# Patient Record
Sex: Female | Born: 1990 | Race: White | Hispanic: No | Marital: Single | Smoking: Former smoker
Health system: Southern US, Community
[De-identification: ages and names within clinical notes are randomized; demographics above are authoritative.]

## PROBLEM LIST (undated history)

## (undated) DIAGNOSIS — F419 Anxiety disorder, unspecified: Secondary | ICD-10-CM

## (undated) HISTORY — PX: BACK SURGERY: SHX140

---
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
## 2008-08-22 ENCOUNTER — Emergency Department: Payer: Self-pay | Admitting: Emergency Medicine

## 2014-09-09 ENCOUNTER — Ambulatory Visit: Payer: Self-pay

## 2014-09-09 LAB — RAPID STREP-A WITH REFLX: Micro Text Report: NEGATIVE

## 2014-09-12 LAB — BETA STREP CULTURE(ARMC)

## 2015-02-09 ENCOUNTER — Ambulatory Visit
Admit: 2015-02-09 | Disposition: A | Discharge status: Home / Self Care | Payer: Self-pay | Attending: Internal Medicine | Admitting: Internal Medicine

## 2016-01-18 ENCOUNTER — Emergency Department
Admission: EM | Admit: 2016-01-18 | Discharge: 2016-01-18 | Disposition: A | Discharge status: Home / Self Care | Payer: BC Managed Care – PPO | Attending: Emergency Medicine | Admitting: Emergency Medicine

## 2016-01-18 ENCOUNTER — Encounter: Payer: Self-pay | Admitting: Emergency Medicine

## 2016-01-18 DIAGNOSIS — W260XXA Contact with knife, initial encounter: Secondary | ICD-10-CM | POA: Insufficient documentation

## 2016-01-18 DIAGNOSIS — Y92009 Unspecified place in unspecified non-institutional (private) residence as the place of occurrence of the external cause: Secondary | ICD-10-CM | POA: Diagnosis not present

## 2016-01-18 DIAGNOSIS — Y998 Other external cause status: Secondary | ICD-10-CM | POA: Insufficient documentation

## 2016-01-18 DIAGNOSIS — S61311A Laceration without foreign body of left index finger with damage to nail, initial encounter: Secondary | ICD-10-CM

## 2016-01-18 DIAGNOSIS — Z23 Encounter for immunization: Secondary | ICD-10-CM | POA: Insufficient documentation

## 2016-01-18 DIAGNOSIS — Y9389 Activity, other specified: Secondary | ICD-10-CM | POA: Diagnosis not present

## 2016-01-18 MED ORDER — BACITRACIN ZINC 500 UNIT/GM EX OINT
TOPICAL_OINTMENT | Freq: Two times a day (BID) | CUTANEOUS | Status: DC
  Administered 2016-01-18: 1 via TOPICAL
  Filled 2016-01-18: qty 0.9

## 2016-01-18 MED ORDER — OXYCODONE-ACETAMINOPHEN 5-325 MG PO TABS
1.0000 | ORAL_TABLET | Freq: Once | ORAL | Status: AC
  Administered 2016-01-18: 1 via ORAL
  Filled 2016-01-18: qty 1

## 2016-01-18 MED ORDER — TETANUS-DIPHTH-ACELL PERTUSSIS 5-2.5-18.5 LF-MCG/0.5 IM SUSP
0.5000 mL | Freq: Once | INTRAMUSCULAR | Status: AC
  Administered 2016-01-18: 0.5 mL via INTRAMUSCULAR
  Filled 2016-01-18: qty 0.5

## 2016-01-18 MED ORDER — OXYCODONE-ACETAMINOPHEN 7.5-325 MG PO TABS: 1.0000 | ORAL_TABLET | Freq: Four times a day (QID) | ORAL | Status: DC | PRN

## 2016-01-18 NOTE — Discharge Instructions (Signed)
Laceration Care, Adult °A laceration is a cut that goes through all layers of the skin. The cut also goes into the tissue that is right under the skin. Some cuts heal on their own. Others need to be closed with stitches (sutures), staples, skin adhesive strips, or wound glue. Taking care of your cut lowers your risk of infection and helps your cut to heal better. °HOW TO TAKE CARE OF YOUR CUT °For stitches or staples: °· Keep the wound clean and dry. °· If you were given a bandage (dressing), you should change it at least one time per day or as told by your doctor. You should also change it if it gets wet or dirty. °· Keep the wound completely dry for the first 24 hours or as told by your doctor. After that time, you may take a shower or a bath. However, make sure that the wound is not soaked in water until after the stitches or staples have been removed. °· Clean the wound one time each day or as told by your doctor: °· Wash the wound with soap and water. °· Rinse the wound with water until all of the soap comes off. °· Pat the wound dry with a clean towel. Do not rub the wound. °· After you clean the wound, put a thin layer of antibiotic ointment on it as told by your doctor. This ointment: °· Helps to prevent infection. °· Keeps the bandage from sticking to the wound. °· Have your stitches or staples removed as told by your doctor. °If your doctor used skin adhesive strips:  °· Keep the wound clean and dry. °· If you were given a bandage, you should change it at least one time per day or as told by your doctor. You should also change it if it gets dirty or wet. °· Do not get the skin adhesive strips wet. You can take a shower or a bath, but be careful to keep the wound dry. °· If the wound gets wet, pat it dry with a clean towel. Do not rub the wound. °· Skin adhesive strips fall off on their own. You can trim the strips as the wound heals. Do not remove any strips that are still stuck to the wound. They will  fall off after a while. °If your doctor used wound glue: °· Try to keep your wound dry, but you may briefly wet it in the shower or bath. Do not soak the wound in water, such as by swimming. °· After you take a shower or a bath, gently pat the wound dry with a clean towel. Do not rub the wound. °· Do not do any activities that will make you really sweaty until the skin glue has fallen off on its own. °· Do not apply liquid, cream, or ointment medicine to your wound while the skin glue is still on. °· If you were given a bandage, you should change it at least one time per day or as told by your doctor. You should also change it if it gets dirty or wet. °· If a bandage is placed over the wound, do not let the tape for the bandage touch the skin glue. °· Do not pick at the glue. The skin glue usually stays on for 5-10 days. Then, it falls off of the skin. °General Instructions  °· To help prevent scarring, make sure to cover your wound with sunscreen whenever you are outside after stitches are removed, after adhesive strips are removed,   or when wound glue stays in place and the wound is healed. Make sure to wear a sunscreen of at least 30 SPF.  Take over-the-counter and prescription medicines only as told by your doctor.  If you were given antibiotic medicine or ointment, take or apply it as told by your doctor. Do not stop using the antibiotic even if your wound is getting better.  Do not scratch or pick at the wound.  Keep all follow-up visits as told by your doctor. This is important.  Check your wound every day for signs of infection. Watch for:  Redness, swelling, or pain.  Fluid, blood, or pus.  Raise (elevate) the injured area above the level of your heart while you are sitting or lying down, if possible. GET HELP IF:  You got a tetanus shot and you have any of these problems at the injection site:  Swelling.  Very bad pain.  Redness.  Bleeding.  You have a fever.  A wound that was  closed breaks open.  You notice a bad smell coming from your wound or your bandage.  You notice something coming out of the wound, such as wood or glass.  Medicine does not help your pain.  You have more redness, swelling, or pain at the site of your wound.  You have fluid, blood, or pus coming from your wound.  You notice a change in the color of your skin near your wound.  You need to change the bandage often because fluid, blood, or pus is coming from the wound.  You start to have a new rash.  You start to have numbness around the wound. GET HELP RIGHT AWAY IF:  You have very bad swelling around the wound.  Your pain suddenly gets worse and is very bad.  You notice painful lumps near the wound or on skin that is anywhere on your body.  You have a red streak going away from your wound.  The wound is on your hand or foot and you cannot move a finger or toe like you usually can.  The wound is on your hand or foot and you notice that your fingers or toes look pale or bluish.   This information is not intended to replace advice given to you by your health care provider. Make sure you discuss any questions you have with your health care provider.   Document Released: 04/12/2008 Document Revised: 03/11/2015 Document Reviewed: 10/21/2014 Elsevier Interactive Patient Education 2016 [employer]  Fingernail or Toenail Removal, Care After Refer to this sheet in the next few weeks. These instructions provide you with information about caring for yourself after your procedure. Your health care provider may also give you more specific instructions. Your treatment has been planned according to current medical practices, but problems sometimes occur. Call your health care provider if you have any problems or questions after your procedure. WHAT TO EXPECT AFTER THE PROCEDURE After your procedure, it is common to have:  Redness.  Swelling. HOME CARE INSTRUCTIONS  If you have a  splint on your finger:  Wear it as directed by your health care provider. Remove it only as directed by your health care provider.  Loosen the splint if your fingers become numb and tingle, or if they turn cold and blue.  If you were given a surgical shoe, wear it as directed by your health care provider.  Take medicines only as directed by your health care provider.  Elevate your hand or foot as much  of the time as possible. This helps with pain and swelling.  If you are recovering from fingernail removal, keep your hand raised above the level of your heart.  If you are recovering from toenail removal, lie on a bed or a couch with your leg propped up on pillows, or sit in a reclining chair with the footrest up.  Follow instructions from your health care provider about bandage (dressing) changes and removal:  Change your dressing 24 hours after your procedure or as directed by your health care provider.  Soak your hand or foot in warm, soapy water for 10-20 minutes or as directed by your health care provider. Do this 3 times per day or as directed by your health care provider. This reduces pain and swelling.  After you soak your hand or foot, apply a clean, dry dressing.  Keep your dressing clean and dry. Change your dressing whenever it gets wet or dirty.  Keep all follow-up visits as directed by your health care provider. This is important. SEEK MEDICAL CARE IF:  You have increased redness or pain at your nail area.  You have increased fluid, blood, or pus coming from your nail area.  There is a bad smell coming from the dressing.  You have a fever.  Your swelling gets worse, or you have swelling that spreads from your finger to your hand or from your toe to your foot.  You have worsening redness that spreads from your finger to your hand or from your toe up to your foot.  Your finger or toe looks blue or black.   This information is not intended to replace advice given  to you by your health care provider. Make sure you discuss any questions you have with your health care provider.   Document Released: 11/15/2014 Document Reviewed: 11/15/2014 Elsevier Interactive Patient Education [employer]

## 2016-01-18 NOTE — ED Notes (Signed)
Pt states lacerated left index finger with knife approx 1.5 hours pta. Cms intact to finger approx 1/2 linear laceration noted to tip of finger through nail bed.

## 2016-01-18 NOTE — ED Provider Notes (Signed)
[facility] Emergency Department Provider Note  ____________________________________________  Time seen: Approximately 8:16 PM  I have reviewed the triage vital signs and the nursing notes.   HISTORY  Chief Complaint Ear Laceration    HPI Jennifer Harper is a 25 y.o. female patient presented with laceration to left index finger which happened at home approximately 2 hours ago. Laceration involves the distal digit and also the nail. Patient denies any loss of sensation or loss of function. She tetanus shot is not up-to-date.Patient rates her pain as a 5/10 and describes the pain as "sharp". No palliative measures given prior to arrival. Patient is right-hand dominant.   History reviewed. No pertinent past medical history.  There are no active problems to display for this patient.   History reviewed. No pertinent past surgical history.  No current outpatient prescriptions on file.  Allergies Review of patient's allergies indicates no known allergies.  No family history on file.  Social History Social History  Substance Use Topics  . Smoking status: None  . Smokeless tobacco: None  . Alcohol Use: None    Review of Systems Constitutional: No fever/chills Eyes: No visual changes. ENT: No sore throat. Cardiovascular: Denies chest pain. Respiratory: Denies shortness of breath. Gastrointestinal: No abdominal pain.  No nausea, no vomiting.  No diarrhea.  No constipation. Genitourinary: Negative for dysuria. Musculoskeletal: Negative for back pain. Skin: Negative for rash. Laceration to the left index finger Neurological: Negative for headaches, focal weakness or numbness.    ____________________________________________   PHYSICAL EXAM:  VITAL SIGNS: ED Triage Vitals  Enc Vitals Group     BP 01/18/16 1935 108/64 mmHg     Pulse Rate 01/18/16 1935 58     Resp 01/18/16 1935 16     Temp 01/18/16 1935 98.2 F (36.8 C)     Temp Source  01/18/16 1935 Oral     SpO2 01/18/16 1935 100 %     Weight 01/18/16 1935 140 lb (63.504 kg)     Height 01/18/16 1935 5\' 5"  (1.651 m)     Head Cir --      Peak Flow --      Pain Score 01/18/16 1935 5     Pain Loc --      Pain Edu? --      Excl. in GC? --     Constitutional: Alert and oriented. Well appearing and in no acute distress. Eyes: Conjunctivae are normal. PERRL. EOMI. Head: Atraumatic. Nose: No congestion/rhinnorhea. Mouth/Throat: Mucous membranes are moist.  Oropharynx non-erythematous. Neck: No stridor.  No cervical spine tenderness to palpation. Hematological/Lymphatic/Immunilogical: No cervical lymphadenopathy. Cardiovascular: Normal rate, regular rhythm. Grossly normal heart sounds.  Good peripheral circulation. Respiratory: Normal respiratory effort.  No retractions. Lungs CTAB. Gastrointestinal: Soft and nontender. No distention. No abdominal bruits. No CVA tenderness. Musculoskeletal: No lower extremity tenderness nor edema.  No joint effusions. Neurologic:  Normal speech and language. No gross focal neurologic deficits are appreciated. No gait instability. Skin:  Skin is warm, dry and intact. No rash noted. Psychiatric: Mood and affect are normal. Speech and behavior are normal.  ____________________________________________   LABS (all labs ordered are listed, but only abnormal results are displayed)  Labs Reviewed - No data to display ____________________________________________  EKG   ____________________________________________  RADIOLOGY   ____________________________________________   PROCEDURES  Procedure(s) performed: See procedure note  Critical Care performed: No  LACERATION REPAIR Performed by: Joni Reiningonald K Smith Authorized by: Joni Reiningonald K Smith Consent: Verbal consent obtained. Risks and  benefits: risks, benefits and alternatives were discussed Consent given by: patient Patient identity confirmed: provided demographic data Prepped and  Draped in normal sterile fashion Wound explored  Laceration Location: Left distal index finger  Laceration Length: 0.5 cm  No Foreign Bodies seen or palpated  Anesthesia: Digital block Local anesthetic: Lidocaine 1% without epinephrine  Anesthetic total: 4 ML  Irrigation method: syringe Amount of cleaning: standard  Skin closure: 4-0 nylon   Number of sutures: 7   Technique: Interrupted Patient tolerance: Patient tolerated the procedure well with no immediate complications.  __________________________________________   INITIAL IMPRESSION / ASSESSMENT AND PLAN / ED COURSE  Pertinent labs & imaging results that were available during my care of the patient were reviewed by me and considered in my medical decision making (see chart for details).  Laceration left index finger with partial nail involvement. Patient given discharge care instructions. She given a tetanus shot in the ED. She given prescription for Percocets and advised to return in 10 days for suture removal. Advised patient also has suture removal by urgent care family doctor. ____________________________________________   FINAL CLINICAL IMPRESSION(S) / ED DIAGNOSES  Final diagnoses:  Laceration of left index finger w/o foreign body with damage to nail, initial encounter      Joni Reining, PA-C 01/18/16 2055  Sharyn Creamer, MD 01/19/16 [phone]

## 2016-01-28 ENCOUNTER — Emergency Department
Admission: EM | Admit: 2016-01-28 | Discharge: 2016-01-28 | Disposition: A | Discharge status: Home / Self Care | Payer: BC Managed Care – PPO | Attending: Emergency Medicine | Admitting: Emergency Medicine

## 2016-01-28 DIAGNOSIS — IMO0002 Reserved for concepts with insufficient information to code with codable children: Secondary | ICD-10-CM

## 2016-01-28 DIAGNOSIS — Z4800 Encounter for change or removal of nonsurgical wound dressing: Secondary | ICD-10-CM | POA: Insufficient documentation

## 2016-01-28 NOTE — ED Provider Notes (Signed)
[facility] Emergency Department Provider Note  ____________________________________________  Time seen: Approximately 8:47 AM  I have reviewed the triage vital signs and the nursing notes.   HISTORY  Chief Complaint No chief complaint on file.    HPI Jennifer Harper is a 25 y.o. female patient here today for suture removal. 10 days ago patient had a laceration to the left index finger. Patient was seen and treated in this ED. Patient has been applying Neosporin to the area 3 times a day.   No past medical history on file.  There are no active problems to display for this patient.   No past surgical history on file.  Current Outpatient Rx  Name  Route  Sig  Dispense  Refill  . oxyCODONE-acetaminophen (PERCOCET) 7.5-325 MG tablet   Oral   Take 1 tablet by mouth every 6 (six) hours as needed for severe pain.   12 tablet   0     Allergies Review of patient's allergies indicates no known allergies.  No family history on file.  Social History Social History  Substance Use Topics  . Smoking status: Not on file  . Smokeless tobacco: Not on file  . Alcohol Use: Not on file    Review of Systems Constitutional: No fever/chills Eyes: No visual changes. ENT: No sore throat. Cardiovascular: Denies chest pain. Respiratory: Denies shortness of breath. Gastrointestinal: No abdominal pain.  No nausea, no vomiting.  No diarrhea.  No constipation. Genitourinary: Negative for dysuria. Musculoskeletal: Negative for back pain. Skin: Negative for rash. Left index finger laceration Neurological: Negative for headaches, focal weakness or numbness.   ____________________________________________   PHYSICAL EXAM:  VITAL SIGNS: ED Triage Vitals  Enc Vitals Group     BP --      Pulse --      Resp --      Temp 01/28/16 0843 98.4 F (36.9 C)     Temp Source 01/28/16 0843 Oral     SpO2 --      Weight --      Height --      Head Cir --    Peak Flow --      Pain Score --      Pain Loc --      Pain Edu? --      Excl. in GC? --     Constitutional: Alert and oriented. Well appearing and in no acute distress. Eyes: Conjunctivae are normal. PERRL. EOMI. Head: Atraumatic. Nose: No congestion/rhinnorhea. Mouth/Throat: Mucous membranes are moist.  Oropharynx non-erythematous. Neck: No stridor. No cervical spine tenderness to palpation. Hematological/Lymphatic/Immunilogical: No cervical lymphadenopathy. Cardiovascular: Normal rate, regular rhythm. Grossly normal heart sounds.  Good peripheral circulation. Respiratory: Normal respiratory effort.  No retractions. Lungs CTAB. Gastrointestinal: Soft and nontender. No distention. No abdominal bruits. No CVA tenderness. Musculoskeletal: No lower extremity tenderness nor edema.  No joint effusions. Neurologic:  Normal speech and language. No gross focal neurologic deficits are appreciated. No gait instability. Skin: She is area of skin is very more secondary to continued use of Neosporin.  Psychiatric: Mood and affect are normal. Speech and behavior are normal.  ____________________________________________   LABS (all labs ordered are listed, but only abnormal results are displayed)  Labs Reviewed - No data to display ____________________________________________  EKG   ____________________________________________  RADIOLOGY   ____________________________________________   PROCEDURES  Procedure(s) performed: None  Critical Care performed: No  ____________________________________________   INITIAL IMPRESSION / ASSESSMENT AND PLAN / ED COURSE  Pertinent  labs & imaging results that were available during my care of the patient were reviewed by me and considered in my medical decision making (see chart for details).  Incision site for more secondary to continue use of Neosporin. Advised patient to keep the area clean and dry and return back in 3 days for suture  removal. ____________________________________________   FINAL CLINICAL IMPRESSION(S) / ED DIAGNOSES  Final diagnoses:  Encounter for re-check of laceration wound      Joni Reining, PA-C 01/29/16 1649  Emily Filbert, MD 01/30/16 1101

## 2016-01-28 NOTE — Discharge Instructions (Signed)
Deep area clean and dry.

## 2016-01-28 NOTE — ED Notes (Signed)
Pt here for suture removal from left index finger. Pt had placed 7 days ago. Incision site noted to be very moist and unable to remove sutures at this time. Pt to return back this Friday for suture removal.

## 2016-01-31 ENCOUNTER — Emergency Department
Admission: EM | Admit: 2016-01-31 | Discharge: 2016-01-31 | Disposition: A | Discharge status: Home / Self Care | Payer: BC Managed Care – PPO | Attending: Emergency Medicine | Admitting: Emergency Medicine

## 2016-01-31 ENCOUNTER — Encounter: Payer: Self-pay | Admitting: Emergency Medicine

## 2016-01-31 DIAGNOSIS — Z4802 Encounter for removal of sutures: Secondary | ICD-10-CM

## 2016-01-31 NOTE — ED Provider Notes (Signed)
[facility] Emergency Department Provider Note ____________________________________________  Time seen: Approximately 11:58 AM  I have reviewed the triage vital signs and the nursing notes.   HISTORY  Chief Complaint Suture / Staple Removal   HPI Jennifer Harper is a 25 y.o. female is here for suture removal from her left index finger. Patient was seen in the emergency room and laceration is sutured approximately 11 days ago. Patient denies any pain or fever. There is been no drainage.She rates her pain a 4 out of 10.   History reviewed. No pertinent past medical history.  There are no active problems to display for this patient.   Past Surgical History  Procedure Laterality Date  . Back surgery      Current Outpatient Rx  Name  Route  Sig  Dispense  Refill  . oxyCODONE-acetaminophen (PERCOCET) 7.5-325 MG tablet   Oral   Take 1 tablet by mouth every 6 (six) hours as needed for severe pain.   12 tablet   0     Allergies Review of patient's allergies indicates no known allergies.  No family history on file.  Social History Social History  Substance Use Topics  . Smoking status: Never Smoker   . Smokeless tobacco: None  . Alcohol Use: Yes    Review of Systems Constitutional: No fever/chills Skin: Negative for infection Neurological: Negative for focal weakness or numbness.  10-point ROS otherwise negative.  ____________________________________________   PHYSICAL EXAM:  VITAL SIGNS: ED Triage Vitals  Enc Vitals Group     BP 01/31/16 1147 116/65 mmHg     Pulse Rate 01/31/16 1147 65     Resp 01/31/16 1147 20     Temp 01/31/16 1147 97.8 F (36.6 C)     Temp Source 01/31/16 1147 Oral     SpO2 01/31/16 1147 100 %     Weight 01/31/16 1147 139 lb (63.05 kg)     Height 01/31/16 1147 5\' 5"  (1.651 m)     Head Cir --      Peak Flow --      Pain Score 01/31/16 1148 4     Pain Loc --      Pain Edu? --      Excl. in GC? --      Constitutional: Alert and oriented. Well appearing and in no acute distress. Eyes: Conjunctivae are normal. PERRL. EOMI. Head: Atraumatic. Neck: No stridor.   Respiratory: Normal respiratory effort.  Gastrointestinal: Soft and nontender. No distention. Musculoskeletal:Range of motion of the left index finger is without restriction. Neurologic:  Normal speech and language. No gross focal neurologic deficits are appreciated. No gait instability. Skin:  Skin is warm, dry and intact. Well healed laceration site without erythema or drainage. Psychiatric: Mood and affect are normal. Speech and behavior are normal.  ____________________________________________   LABS (all labs ordered are listed, but only abnormal results are displayed)  Labs Reviewed - No data to display  PROCEDURES  Procedure(s) performed: None  Critical Care performed: No  ____________________________________________   INITIAL IMPRESSION / ASSESSMENT AND PLAN / ED COURSE  Pertinent labs & imaging results that were available during my care of the patient were reviewed by me and considered in my medical decision making (see chart for details).  Sutures were removed and patient was discharged. ____________________________________________   FINAL CLINICAL IMPRESSION(S) / ED DIAGNOSES  Final diagnoses:  Encounter for removal of sutures      Tommi RumpsRhonda L Summers, PA-C 01/31/16 1204  Anne-Caroline Sharma CovertNorman,  MD 01/31/16 1651

## 2016-01-31 NOTE — ED Notes (Signed)
Here for suture removal, sutures noted L index finger

## 2016-01-31 NOTE — Discharge Instructions (Signed)

## 2016-01-31 NOTE — ED Notes (Signed)
AAOx3.  Skin warm and dry.  NAD 

## 2017-03-09 ENCOUNTER — Emergency Department: Payer: BC Managed Care – PPO

## 2017-03-09 ENCOUNTER — Encounter: Payer: Self-pay | Admitting: Emergency Medicine

## 2017-03-09 DIAGNOSIS — R0602 Shortness of breath: Secondary | ICD-10-CM | POA: Diagnosis not present

## 2017-03-09 DIAGNOSIS — R0789 Other chest pain: Secondary | ICD-10-CM | POA: Insufficient documentation

## 2017-03-09 LAB — COMPREHENSIVE METABOLIC PANEL
ALT: 11 U/L — ABNORMAL LOW (ref 14–54)
AST: 20 U/L (ref 15–41)
Albumin: 4.1 g/dL (ref 3.5–5.0)
Alkaline Phosphatase: 51 U/L (ref 38–126)
Anion gap: 6 (ref 5–15)
BUN: 13 mg/dL (ref 6–20)
CO2: 28 mmol/L (ref 22–32)
Calcium: 9.2 mg/dL (ref 8.9–10.3)
Chloride: 105 mmol/L (ref 101–111)
Creatinine, Ser: 0.77 mg/dL (ref 0.44–1.00)
GFR calc Af Amer: >60 mL/min (ref >60)
GFR calc non Af Amer: >60 mL/min (ref >60)
Glucose, Bld: 102 mg/dL — ABNORMAL HIGH (ref 65–99)
Potassium: 3.2 mmol/L — ABNORMAL LOW (ref 3.5–5.1)
Sodium: 139 mmol/L (ref 135–145)
Total Bilirubin: 0.4 mg/dL (ref 0.3–1.2)
Total Protein: 7.6 g/dL (ref 6.5–8.1)

## 2017-03-09 LAB — CBC
HCT: 39.2 % (ref 35.0–47.0)
Hemoglobin: 13 g/dL (ref 12.0–16.0)
MCH: 29.2 pg (ref 26.0–34.0)
MCHC: 33.2 g/dL (ref 32.0–36.0)
MCV: 88.1 fL (ref 80.0–100.0)
Platelets: 289 10*3/uL (ref 150–440)
RBC: 4.44 MIL/uL (ref 3.80–5.20)
RDW: 13.6 % (ref 11.5–14.5)
WBC: 8.1 10*3/uL (ref 3.6–11.0)

## 2017-03-09 LAB — TROPONIN I: Troponin I: <0.03 ng/mL (ref <0.03)

## 2017-03-09 NOTE — ED Triage Notes (Signed)
Pt in with co chest heaviness and shob that started yesterday, no hx of the same. Denies any recent illness.

## 2017-03-10 ENCOUNTER — Emergency Department
Admission: EM | Admit: 2017-03-10 | Discharge: 2017-03-10 | Disposition: A | Discharge status: Home / Self Care | Payer: BC Managed Care – PPO | Attending: Emergency Medicine | Admitting: Emergency Medicine

## 2017-03-10 DIAGNOSIS — R079 Chest pain, unspecified: Secondary | ICD-10-CM

## 2017-03-10 LAB — TROPONIN I: Troponin I: <0.03 ng/mL (ref <0.03)

## 2017-03-10 LAB — FIBRIN DERIVATIVES D-DIMER (ARMC ONLY): Fibrin derivatives D-dimer (ARMC): 350.88 (ref 0.00–499.00)

## 2017-03-10 LAB — LIPASE, BLOOD: Lipase: 30 U/L (ref 11–51)

## 2017-03-10 MED ORDER — POTASSIUM CHLORIDE CRYS ER 20 MEQ PO TBCR
40.0000 meq | EXTENDED_RELEASE_TABLET | Freq: Once | ORAL | Status: AC
  Administered 2017-03-10: 40 meq via ORAL
  Filled 2017-03-10: qty 2

## 2017-03-10 MED ORDER — ALBUTEROL SULFATE (2.5 MG/3ML) 0.083% IN NEBU
INHALATION_SOLUTION | RESPIRATORY_TRACT | Status: AC
  Filled 2017-03-10: qty 3

## 2017-03-10 MED ORDER — ALBUTEROL SULFATE (2.5 MG/3ML) 0.083% IN NEBU
2.5000 mg | INHALATION_SOLUTION | Freq: Once | RESPIRATORY_TRACT | Status: AC
  Administered 2017-03-10: 2.5 mg via RESPIRATORY_TRACT

## 2017-03-10 NOTE — Discharge Instructions (Signed)
Please discontinue your weight loss supplement as this may be contributing to your symptoms. Return to the ER for worsening symptoms, persistent vomiting, difficulty breathing or other concerns.

## 2017-03-10 NOTE — ED Provider Notes (Signed)
[facility] Emergency Department Provider Note   ____________________________________________   First MD Initiated Contact with Patient 03/10/17 0009     (approximate)  I have reviewed the triage vital signs and the nursing notes.   HISTORY  Chief Complaint Chest Pain    HPI Jennifer Harper is a 26 y.o. female who presents to the ED from home with a chief complaint of chest discomfort.Patient reports a one-day history of intermittent chest heaviness accompanied with shortness of breath. She was able to work out at the gym without difficulty. Describes central chest discomfort which is nonradiating and not accompanied by diaphoresis, dizziness, palpitations, numbness/tingling, nausea or vomiting. Patient denies recent fever, chills, cough, congestion. Denies recent travel, trauma or OCP use. Nothing makes her symptoms better. She feels like she cannot get a full breath. Reports recently started a over-the-counter thermogenic weight loss supplement.   Past medical history None  There are no active problems to display for this patient.   Past Surgical History:  Procedure Laterality Date  . BACK SURGERY      Prior to Admission medications   Medication Sig Start Date End Date Taking? Authorizing Provider  oxyCODONE-acetaminophen (PERCOCET) 7.5-325 MG tablet Take 1 tablet by mouth every 6 (six) hours as needed for severe pain. 01/18/16   Joni Reining, PA-C    Allergies Patient has no known allergies.  Family history Father with "heart issues" but never heart attack  Social History Social History  Substance Use Topics  . Smoking status: Never Smoker  . Smokeless tobacco: Not on file  . Alcohol use Yes  Social smoker Denies illicit drug use  Review of Systems  Constitutional: No fever/chills. Eyes: No visual changes. ENT: No sore throat. Cardiovascular: Positive for chest pain. Respiratory: Positive for shortness of  breath. Gastrointestinal: No abdominal pain.  No nausea, no vomiting.  No diarrhea.  No constipation. Genitourinary: Negative for dysuria. Musculoskeletal: Negative for back pain. Skin: Negative for rash. Neurological: Negative for headaches, focal weakness or numbness. Psychiatric:Positive for anxiety.  ____________________________________________   PHYSICAL EXAM:  VITAL SIGNS: ED Triage Vitals [03/09/17 2120]  Enc Vitals Group     BP      Pulse Rate 61     Resp 18     Temp 98.1 F (36.7 C)     Temp src      SpO2 100 %     Weight 143 lb (64.9 kg)     Height 5\' 5"  (1.651 m)     Head Circumference      Peak Flow      Pain Score 4     Pain Loc      Pain Edu?      Excl. in GC?     Constitutional: Alert and oriented. Well appearing and in no acute distress. Eyes: Conjunctivae are normal. PERRL. EOMI. Head: Atraumatic. Nose: No congestion/rhinnorhea. Mouth/Throat: Mucous membranes are moist.  Oropharynx non-erythematous. Neck: No stridor.   Cardiovascular: Normal rate, regular rhythm. Grossly normal heart sounds.  Good peripheral circulation. Respiratory: Normal respiratory effort.  No retractions. Lungs CTAB. Gastrointestinal: Soft and nontender. No distention. No abdominal bruits. No CVA tenderness. Musculoskeletal: No lower extremity tenderness nor edema.  No joint effusions.  Calves are supple and nontender bilaterally. Neurologic:  Normal speech and language. No gross focal neurologic deficits are appreciated. No gait instability. Skin:  Skin is warm, dry and intact. No rash noted. Psychiatric: Mood and affect are anxious. Speech and behavior are normal.  ____________________________________________   LABS (all labs ordered are listed, but only abnormal results are displayed)  Labs Reviewed  COMPREHENSIVE METABOLIC PANEL - Abnormal; Notable for the following:       Result Value   Potassium 3.2 (*)    Glucose, Bld 102 (*)    ALT 11 (*)    All other components  within normal limits  CBC  TROPONIN I  LIPASE, BLOOD  TROPONIN I  FIBRIN DERIVATIVES D-DIMER ([facility] ONLY)   ____________________________________________  EKG  ED ECG REPORT I, Ranika Mcniel J, the attending physician, personally viewed and interpreted this ECG.   Date: 03/10/2017  EKG Time: 2146  Rate: 59  Rhythm: normal sinus rhythm  Axis: normal  Intervals:none  ST&T Change: nonspecific  ____________________________________________  RADIOLOGY  Chest 2 view interpreted per Dr. Jake SamplesFujinaga: No active cardiopulmonary disease. ____________________________________________   PROCEDURES  Procedure(s) performed: None  Procedures  Critical Care performed: No  ____________________________________________   INITIAL IMPRESSION / ASSESSMENT AND PLAN / ED COURSE  Pertinent labs & imaging results that were available during my care of the patient were reviewed by me and considered in my medical decision making (see chart for details).  26 year old female who presents with a one-day history of central chest discomfort and feeling like she cannot get a full breath. Initial EKG and troponin are unremarkable. Will administer albuterol nebulizer for symptomatic relief of symptoms, check timed, repeat troponin as well as d-dimer. Wells criteria is 0. PERC 0.  ----------------------------------------- 12:46 AM on 03/10/2017 -----------------------------------------   OBSERVATION CARE: This patient is being placed under observation care for the following reasons: Chest pain with repeat testing to rule out ischemia     Clinical Course as of Mar 10 149  Thu Mar 10, 2017  0142 Patient feeling better after nebulizer treatment. Did offer to prescribe albuterol inhaler but patient declines. She feels much better after reassuring test results. Updated her of repeat negative troponin as well as negative d-dimer. Very low suspicion for ACS, PE, dissection. Advised her to discontinue weight  loss supplement as this may be contributing to her symptoms. Will refer her to cardiology for outpatient follow-up. Strict return precautions given. Patient verbalizes understanding and agrees with plan of care.  [JS]    Clinical Course User Index [JS] Irean HongJade J Melbert Botelho, MD   ----------------------------------------- 1:48 AM on 03/10/2017 -----------------------------------------   END OF OBSERVATION STATUS: After an appropriate period of observation, this patient is being discharged due to the following reason(s):  Clinical improvement of symptoms, 2 sets of negative troponins, negative d-dimer; overall reassuring cardiac workup.  ____________________________________________   FINAL CLINICAL IMPRESSION(S) / ED DIAGNOSES  Final diagnoses:  Nonspecific chest pain      NEW MEDICATIONS STARTED DURING THIS VISIT:  New Prescriptions   No medications on file     Note:  This document was prepared using Dragon voice recognition software and may include unintentional dictation errors.    Irean HongJade J Amena Dockham, MD 03/10/17 [phone]

## 2017-05-27 DIAGNOSIS — F5102 Adjustment insomnia: Secondary | ICD-10-CM | POA: Insufficient documentation

## 2017-10-20 ENCOUNTER — Ambulatory Visit
Admission: EM | Admit: 2017-10-20 | Discharge: 2017-10-20 | Disposition: A | Discharge status: Home / Self Care | Payer: BC Managed Care – PPO | Attending: Family Medicine | Admitting: Family Medicine

## 2017-10-20 ENCOUNTER — Encounter: Payer: Self-pay | Admitting: Emergency Medicine

## 2017-10-20 ENCOUNTER — Other Ambulatory Visit: Payer: Self-pay

## 2017-10-20 DIAGNOSIS — J069 Acute upper respiratory infection, unspecified: Secondary | ICD-10-CM

## 2017-10-20 DIAGNOSIS — R05 Cough: Secondary | ICD-10-CM

## 2017-10-20 DIAGNOSIS — R300 Dysuria: Secondary | ICD-10-CM

## 2017-10-20 HISTORY — DX: Anxiety disorder, unspecified: F41.9

## 2017-10-20 LAB — URINALYSIS, COMPLETE (UACMP) WITH MICROSCOPIC
Bilirubin Urine: NEGATIVE
Glucose, UA: NEGATIVE mg/dL
Hgb urine dipstick: NEGATIVE
Ketones, ur: NEGATIVE mg/dL
Nitrite: NEGATIVE
Protein, ur: NEGATIVE mg/dL
Specific Gravity, Urine: 1.015 (ref 1.005–1.030)
pH: 7.5 (ref 5.0–8.0)

## 2017-10-20 MED ORDER — BENZONATATE 200 MG PO CAPS: ORAL_CAPSULE | ORAL | 0 refills | Status: DC

## 2017-10-20 MED ORDER — CEPHALEXIN 500 MG PO CAPS: 500.0000 mg | ORAL_CAPSULE | Freq: Two times a day (BID) | ORAL | 0 refills | Status: DC

## 2017-10-20 MED ORDER — HYDROCOD POLST-CPM POLST ER 10-8 MG/5ML PO SUER: 5.0000 mL | Freq: Two times a day (BID) | ORAL | 0 refills | Status: DC

## 2017-10-20 NOTE — ED Triage Notes (Signed)
Patient in today c/o 5 days of productive cough (green), congestion. Patient also c/o dysuria off and on x 2 weeks.

## 2017-10-20 NOTE — ED Provider Notes (Signed)
[facility]    CSN: [id number] Arrival date & time: 10/20/17  1236     History   Chief Complaint Chief Complaint  Patient presents with  . Dysuria    HPI Jennifer Harper is a 26 y.o. female.  Female who presents with 2 problems.  First problem is 5 days history of a productive green cough and congestion.  This started initially as a severe headache over the frontal all then he came her sinuses and then finally into her throat and now in her chest.  Has been coughing  green sputum.  She has had no fever but has had some chills over the weekend.  Her second problem is that of dysuria had off and on for the last 2 weeks.  That she does not have a constantly but will happen on occasion.  Also associated with some right-sided low back pain.  Denies any vaginal discharge.  She was on a 2-week trial of birth control pills that she has stopped thinking that that may have been the cause of the dysuria.  Denies any frequency urgency this happened initially on the first week that she had a but this has since quieted great deal.  Previous urinary tract infections and one episode of pyelonephritis.     HPI  Past Medical History:  Diagnosis Date  . Anxiety     There are no active problems to display for this patient.   Past Surgical History:  Procedure Laterality Date  . BACK SURGERY      OB History    No data available       Home Medications    Prior to Admission medications   Medication Sig Start Date End Date Taking? Authorizing Provider  benzonatate (TESSALON) 200 MG capsule Take one cap TID PRN cough 10/20/17   Ovid Curd P, PA-C  cephALEXin (KEFLEX) 500 MG capsule Take 1 capsule (500 mg total) by mouth 2 (two) times daily. 10/20/17   Lutricia Feil, PA-C  chlorpheniramine-HYDROcodone (TUSSIONEX PENNKINETIC ER) 10-8 MG/5ML SUER Take 5 mLs by mouth 2 (two) times daily. 10/20/17   Lutricia Feil, PA-C    Family History Family History    Problem Relation Age of Onset  . Healthy Mother   . Heart disease Father   . Hypertension Father     Social History Social History   Tobacco Use  . Smoking status: Former Smoker    Last attempt to quit: 2011    Years since quitting: 7.9  . Smokeless tobacco: Never Used  Substance Use Topics  . Alcohol use: Yes    Alcohol/week: 1.8 oz    Types: 3 Glasses of wine per week  . Drug use: No     Allergies   Patient has no known allergies.   Review of Systems Review of Systems  Constitutional: Positive for chills. Negative for activity change, appetite change, fatigue and fever.  HENT: Positive for congestion, sinus pressure and sinus pain.   Respiratory: Positive for cough.   All other systems reviewed and are negative.    Physical Exam Triage Vital Signs ED Triage Vitals  Enc Vitals Group     BP 10/20/17 1256 122/71     Pulse Rate 10/20/17 1256 66     Resp 10/20/17 1256 16     Temp 10/20/17 1256 98.5 F (36.9 C)     Temp Source 10/20/17 1256 Oral     SpO2 10/20/17 1256 100 %     Weight 10/20/17  1255 150 lb (68 kg)     Height 10/20/17 1255 5\' 5"  (1.651 m)     Head Circumference --      Peak Flow --      Pain Score 10/20/17 1256 4     Pain Loc --      Pain Edu? --      Excl. in GC? --    No data found.  Updated Vital Signs BP 122/71 (BP Location: Left Arm)   Pulse 66   Temp 98.5 F (36.9 C) (Oral)   Resp 16   Ht 5\' 5"  (1.651 m)   Wt 150 lb (68 kg)   LMP 10/13/2017   SpO2 100%   BMI 24.96 kg/m   Visual Acuity Right Eye Distance:   Left Eye Distance:   Bilateral Distance:    Right Eye Near:   Left Eye Near:    Bilateral Near:     Physical Exam  Constitutional: She is oriented to person, place, and time. She appears well-developed and well-nourished. No distress.  HENT:  Head: Normocephalic.  Right Ear: External ear normal.  Left Ear: External ear normal.  Nose: Nose normal.  Mouth/Throat: Oropharynx is clear and moist. No oropharyngeal  exudate.  Eyes: Pupils are equal, round, and reactive to light. Right eye exhibits no discharge. Left eye exhibits no discharge.  Neck: Normal range of motion.  Pulmonary/Chest: Effort normal and breath sounds normal.  Abdominal: Soft. Bowel sounds are normal. There is no tenderness. There is no rebound and no guarding.  No CVA tenderness  Musculoskeletal: Normal range of motion.  Neurological: She is alert and oriented to person, place, and time.  Skin: Skin is warm and dry. She is not diaphoretic.  Psychiatric: She has a normal mood and affect. Her behavior is normal. Judgment and thought content normal.  Nursing note and vitals reviewed.    UC Treatments / Results  Labs (all labs ordered are listed, but only abnormal results are displayed) Labs Reviewed  URINALYSIS, COMPLETE (UACMP) WITH MICROSCOPIC - Abnormal; Notable for the following components:      Result Value   Color, Urine STRAW (*)    APPearance HAZY (*)    Leukocytes, UA TRACE (*)    Squamous Epithelial / LPF 6-30 (*)    Bacteria, UA FEW (*)    All other components within normal limits  URINE CULTURE    EKG  EKG Interpretation None       Radiology No results found.  Procedures Procedures (including critical care time)  Medications Ordered in UC Medications - No data to display   Initial Impression / Assessment and Plan / UC Course  I have reviewed the triage vital signs and the nursing notes.  Pertinent labs & imaging results that were available during my care of the patient were reviewed by me and considered in my medical decision making (see chart for details).     Plan: 1. Test/x-ray results and diagnosis reviewed with patient 2. rx as per orders; risks, benefits, potential side effects reviewed with patient 3. Recommend supportive treatment with plenty of water.  Discussed with the patient other causes of dysuria. Currently Treat her empirically with Keflex.  Cultures of the urine will be  available in 48 hours.   If These are negative then I have recommended that she return to our clinic or obtain a primary care physician that she can go to for further evaluation.  For her upper respiratory infection and cough I have prescribed  Flonase Tussionex and Tessalon Perles.  I have told her this is likely a viral illness does not require antibiotics at this time. 4. F/u prn if symptoms worsen or don't improve   Final Clinical Impressions(s) / UC Diagnoses   Final diagnoses:  Dysuria  Acute upper respiratory infection    ED Discharge Orders        Ordered    cephALEXin (KEFLEX) 500 MG capsule  2 times daily     10/20/17 1404    chlorpheniramine-HYDROcodone (TUSSIONEX PENNKINETIC ER) 10-8 MG/5ML SUER  2 times daily     10/20/17 1404    benzonatate (TESSALON) 200 MG capsule     10/20/17 1404       Controlled Substance Prescriptions [place] Controlled Substance Registry consulted? Not Applicable   Lutricia FeilRoemer, Jadd Gasior P, PA-C 10/20/17 1425

## 2017-10-20 NOTE — Discharge Instructions (Signed)
Use Flonase nasal spray daily for the next 3-4 weeks.  Take plenty of water.

## 2017-10-22 LAB — URINE CULTURE: Culture: >=100000 — AB

## 2018-03-27 ENCOUNTER — Emergency Department: Payer: Managed Care, Other (non HMO)

## 2018-03-27 ENCOUNTER — Other Ambulatory Visit: Payer: Self-pay

## 2018-03-27 ENCOUNTER — Emergency Department
Admission: EM | Admit: 2018-03-27 | Discharge: 2018-03-27 | Disposition: A | Discharge status: Home / Self Care | Payer: Managed Care, Other (non HMO) | Attending: Emergency Medicine | Admitting: Emergency Medicine

## 2018-03-27 ENCOUNTER — Encounter: Payer: Self-pay | Admitting: Emergency Medicine

## 2018-03-27 DIAGNOSIS — R079 Chest pain, unspecified: Secondary | ICD-10-CM | POA: Diagnosis present

## 2018-03-27 DIAGNOSIS — F410 Panic disorder [episodic paroxysmal anxiety] without agoraphobia: Secondary | ICD-10-CM | POA: Diagnosis not present

## 2018-03-27 DIAGNOSIS — F17290 Nicotine dependence, other tobacco product, uncomplicated: Secondary | ICD-10-CM | POA: Insufficient documentation

## 2018-03-27 LAB — COMPREHENSIVE METABOLIC PANEL
ALT: 13 U/L — ABNORMAL LOW (ref 14–54)
AST: 19 U/L (ref 15–41)
Albumin: 4.3 g/dL (ref 3.5–5.0)
Alkaline Phosphatase: 45 U/L (ref 38–126)
Anion gap: 13 (ref 5–15)
BUN: 9 mg/dL (ref 6–20)
CO2: 20 mmol/L — ABNORMAL LOW (ref 22–32)
Calcium: 9.1 mg/dL (ref 8.9–10.3)
Chloride: 104 mmol/L (ref 101–111)
Creatinine, Ser: 0.75 mg/dL (ref 0.44–1.00)
GFR calc Af Amer: >60 mL/min (ref >60)
GFR calc non Af Amer: >60 mL/min (ref >60)
Glucose, Bld: 112 mg/dL — ABNORMAL HIGH (ref 65–99)
Potassium: 3.4 mmol/L — ABNORMAL LOW (ref 3.5–5.1)
Sodium: 137 mmol/L (ref 135–145)
Total Bilirubin: 1.2 mg/dL (ref 0.3–1.2)
Total Protein: 7.1 g/dL (ref 6.5–8.1)

## 2018-03-27 LAB — CBC
HCT: 41.7 % (ref 35.0–47.0)
Hemoglobin: 14 g/dL (ref 12.0–16.0)
MCH: 29.9 pg (ref 26.0–34.0)
MCHC: 33.6 g/dL (ref 32.0–36.0)
MCV: 88.9 fL (ref 80.0–100.0)
Platelets: 280 10*3/uL (ref 150–440)
RBC: 4.69 MIL/uL (ref 3.80–5.20)
RDW: 14 % (ref 11.5–14.5)
WBC: 7.5 10*3/uL (ref 3.6–11.0)

## 2018-03-27 LAB — TROPONIN I: Troponin I: <0.03 ng/mL (ref <0.03)

## 2018-03-27 MED ORDER — SERTRALINE HCL 50 MG PO TABS: ORAL_TABLET | ORAL | 0 refills | Status: DC

## 2018-03-27 MED ORDER — CLONAZEPAM 0.5 MG PO TABS: 0.5000 mg | ORAL_TABLET | Freq: Two times a day (BID) | ORAL | 0 refills | Status: DC | PRN

## 2018-03-27 MED ORDER — SERTRALINE HCL 50 MG PO TABS
25.0000 mg | ORAL_TABLET | Freq: Once | ORAL | Status: AC
  Administered 2018-03-27: 25 mg via ORAL
  Filled 2018-03-27: qty 1

## 2018-03-27 NOTE — ED Provider Notes (Addendum)
[facility] Emergency Department Provider Note  ____________________________________________   First MD Initiated Contact with Patient 03/27/18 0831     (approximate)  I have reviewed the triage vital signs and the nursing notes.   HISTORY  Chief Complaint Chest Pain and Anxiety   HPI Jennifer Harper is a 27 y.o. female with a history of anxiety formally on Zoloft as well as Klonopin was presented to the emergency department with chest pressure as well as shortness of breath when going outside.  She says that over the past week she has felt increasingly anxious especially when she leaves the house.  She does not note any specific factor that restarted her anxiety but does say that her job is being changed to a different city.  She says that she was on Zoloft as well as Klonopin about a year ago until she self discontinued them for wanting to be off of all medication.  She says that she feels the chest pressure across the front of her chest and says that sometimes she feels difficulty with breathing.  Says that she has an IUD but denies take any birth control pills.  No shortness of breath at this time.  Says that she has had similar symptoms previously with her anxiety.  Past Medical History:  Diagnosis Date  . Anxiety     There are no active problems to display for this patient.   Past Surgical History:  Procedure Laterality Date  . BACK SURGERY      Prior to Admission medications   Medication Sig Start Date End Date Taking? Authorizing Provider  benzonatate (TESSALON) 200 MG capsule Take one cap TID PRN cough 10/20/17   Ovid Curd P, PA-C  cephALEXin (KEFLEX) 500 MG capsule Take 1 capsule (500 mg total) by mouth 2 (two) times daily. 10/20/17   Lutricia Feil, PA-C  chlorpheniramine-HYDROcodone (TUSSIONEX PENNKINETIC ER) 10-8 MG/5ML SUER Take 5 mLs by mouth 2 (two) times daily. 10/20/17   Lutricia Feil, PA-C    Allergies Patient  has no known allergies.  Family History  Problem Relation Age of Onset  . Healthy Mother   . Heart disease Father   . Hypertension Father     Social History Social History   Tobacco Use  . Smoking status: Former Smoker    Last attempt to quit: 2011    Years since quitting: 8.3  . Smokeless tobacco: Current User  . Tobacco comment: vapes  Substance Use Topics  . Alcohol use: Yes    Alcohol/week: 1.8 oz    Types: 3 Glasses of wine per week  . Drug use: No    Review of Systems  Constitutional: No fever/chills Eyes: No visual changes. ENT: No sore throat. Cardiovascular: As above Respiratory: As above Gastrointestinal: No abdominal pain.  No nausea, no vomiting.  No diarrhea.  No constipation. Genitourinary: Negative for dysuria. Musculoskeletal: Negative for back pain. Skin: Negative for rash. Neurological: Negative for headaches, focal weakness or numbness.   ____________________________________________   PHYSICAL EXAM:  VITAL SIGNS: ED Triage Vitals  Enc Vitals Group     BP 03/27/18 0822 (!) 151/95     Pulse Rate 03/27/18 0822 (!) 122     Resp 03/27/18 0822 20     Temp 03/27/18 0822 98 F (36.7 C)     Temp Source 03/27/18 0822 Oral     SpO2 03/27/18 0822 100 %     Weight 03/27/18 0823 150 lb (68 kg)  Height 03/27/18 0823  (1.651 m)     Head Circumference --      Peak Flow --      Pain Score 03/27/18 0822 7     Pain Loc --      Pain Edu? --      Excl. in GC? --     Constitutional: Alert and oriented. Well appearing and in no acute distress.  However, intermittently becomes tearful. Eyes: Conjunctivae are normal.  Head: Atraumatic. Nose: No congestion/rhinnorhea. Mouth/Throat: Mucous membranes are moist.  Neck: No stridor.   Cardiovascular: Normal rate, regular rhythm. Grossly normal heart sounds.   Chest pain is not reproducible to palpation.  Heart rate of 83 bpm in the room. Respiratory: Normal respiratory effort.  No retractions. Lungs  CTAB. Gastrointestinal: Soft and nontender. No distention.  Musculoskeletal: No lower extremity tenderness nor edema.  No joint effusions. Neurologic:  Normal speech and language. No gross focal neurologic deficits are appreciated. Skin:  Skin is warm, dry and intact. No rash noted. Psychiatric: Mood and affect are normal. Speech and behavior are normal.  ____________________________________________   LABS (all labs ordered are listed, but only abnormal results are displayed)  Labs Reviewed  CBC  TROPONIN I  COMPREHENSIVE METABOLIC PANEL  POC URINE PREG, ED   ____________________________________________  EKG  ED ECG REPORT I, Arelia Longest, the attending physician, personally viewed and interpreted this ECG.   Date: 03/27/2018  EKG Time: 0823  Rate: 88  Rhythm: normal sinus rhythm  Axis: Normal  Intervals:none  ST&T Change: No ST segment elevation or depression.  No abnormal T wave inversion.  ____________________________________________  RADIOLOGY  Chest x-ray without any pathology. ____________________________________________   PROCEDURES  Procedure(s) performed:   Procedures  Critical Care performed:   ____________________________________________   INITIAL IMPRESSION / ASSESSMENT AND PLAN / ED COURSE  Pertinent labs & imaging results that were available during my care of the patient were reviewed by me and considered in my medical decision making (see chart for details).  Differential diagnosis includes, but is not limited to, ACS, aortic dissection, pulmonary embolism, cardiac tamponade, pneumothorax, pneumonia, pericarditis, myocarditis, GI-related causes including esophagitis/gastritis, and musculoskeletal chest wall pain.   As part of my medical decision making, I reviewed the following data within the electronic MEDICAL RECORD NUMBER Notes from prior ED visits as well as outpatient family medicine  visits.  ----------------------------------------- 9:22 AM on 03/27/2018 -----------------------------------------  I plan to restart the patient's Zoloft at her previous dose.  We will start with a half tab for 1 week then moved to 1 tab for 2 weeks and then 2 tabs as tolerated.  Discussed this with Dr. Toni Amend who agrees with the plan.  Patient's heart rate now 73 bpm in the room.  Patient without any distress.  Patient with previous symptoms.  Cannot PE RC rule out secondary to patient having an initial tachycardia.  However, the patient has a Wells score which is 1.5 points which is low risk.  Symptoms likely related to panic/anxiety with similar symptoms in the past which were well controlled with a medication regimen.  Patient will also be given follow-up with [facility].  Patient understanding of the diagnosis as well as treatment plan willing to comply. ____________________________________________   FINAL CLINICAL IMPRESSION(S) / ED DIAGNOSES  Panic attacks.  Chest pain.    NEW MEDICATIONS STARTED DURING THIS VISIT:  New Prescriptions   No medications on file     Note:  This document was prepared using Dragon  voice recognition software and may include unintentional dictation errors.     Myrna Blazer, MD 03/27/18 [phone] Database reviewed and patient has not had a narcotic prescription since 2018.    Myrna Blazer, MD 03/27/18 [phone]

## 2018-03-27 NOTE — ED Triage Notes (Signed)
Chest tightness and anxiety x 1 week. Some situational stress. Feeling of not being able to take a deep breath. States has had anxiety in past. Not taking any meds for this currently.

## 2018-06-20 IMAGING — CR DG CHEST 2V
1 series · 2 of 2 positions shown · non-contrast
Comparison: None.

CLINICAL DATA: Chest heaviness and shortness of breath

EXAM:
CHEST  2 VIEW

[Series 1: dg chest 2 view · 0.14mm/px · 2 of 2 slices shown]
[im 1/2]
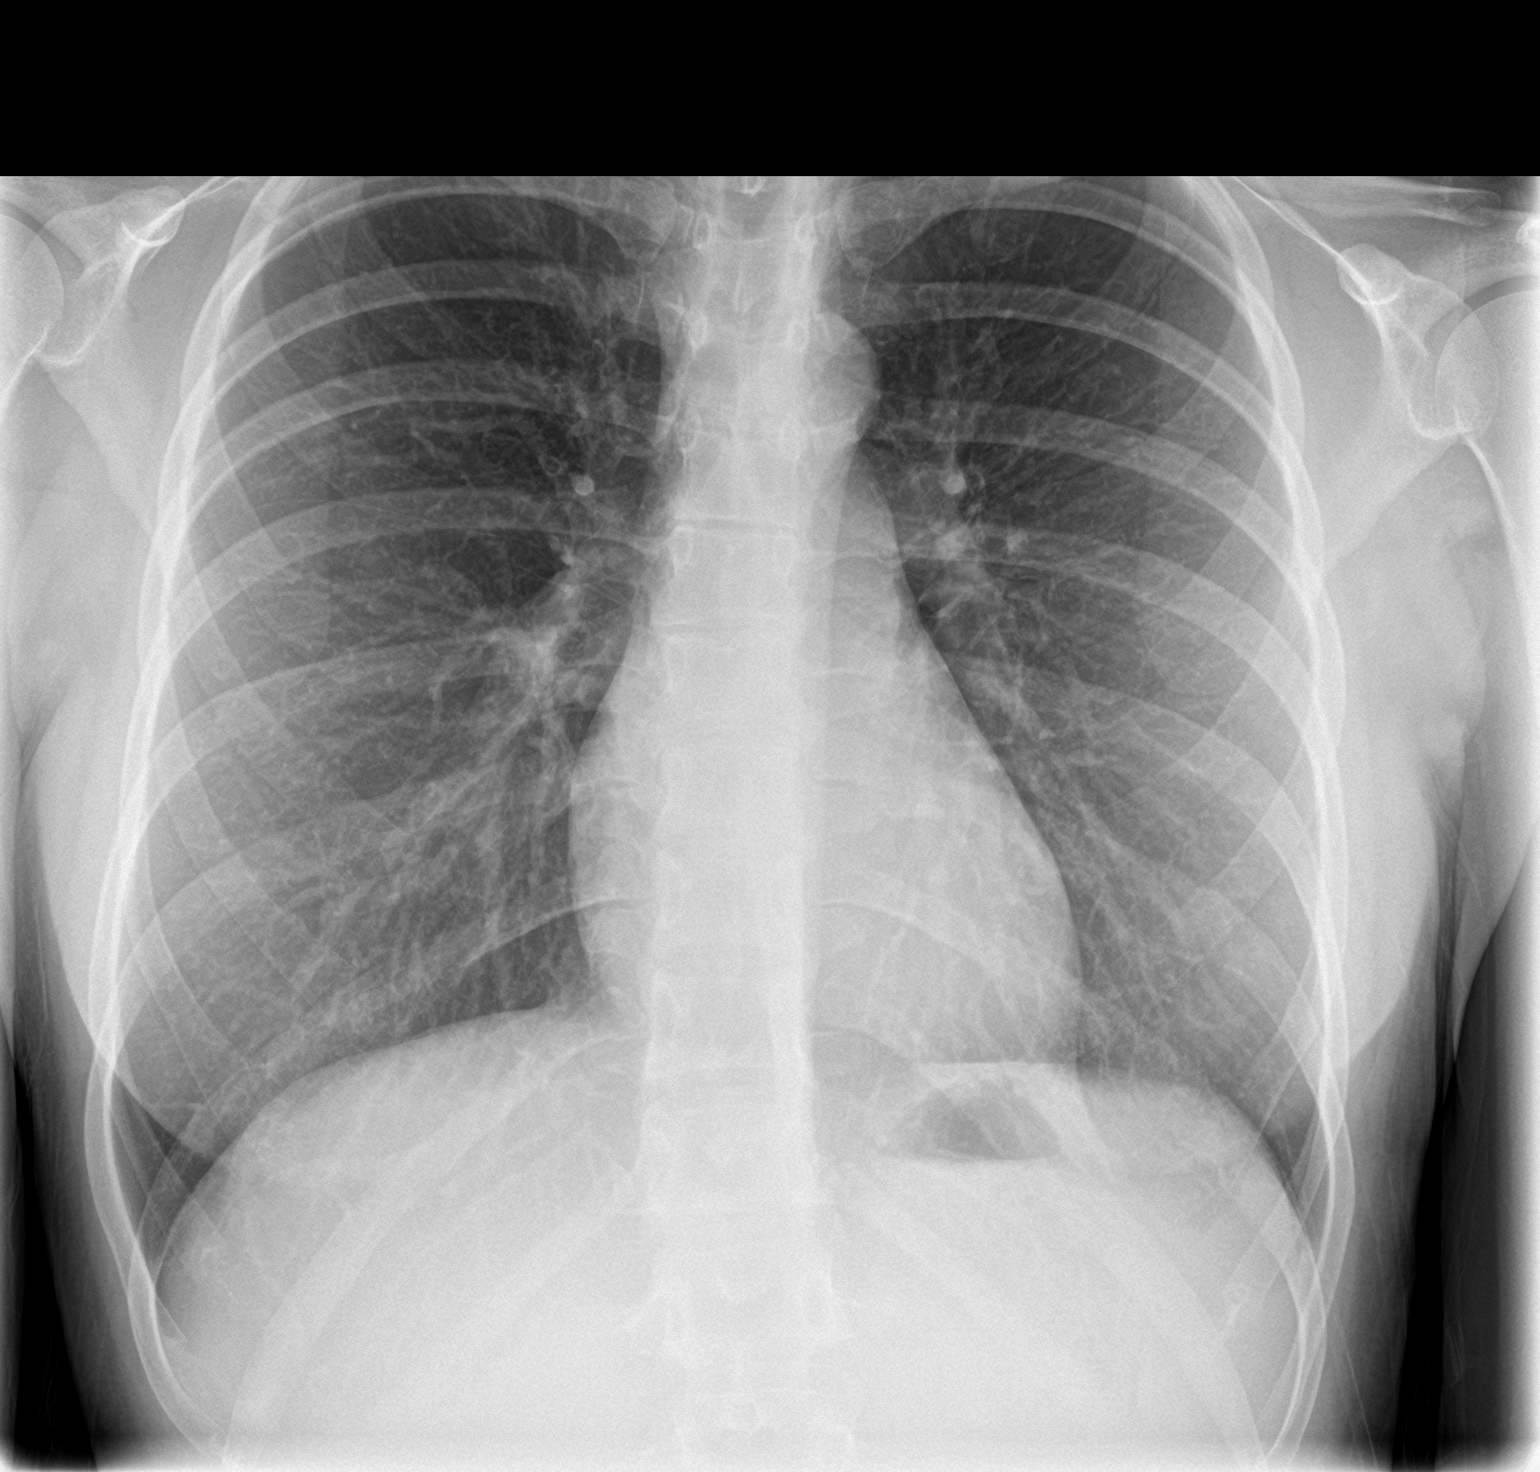
[im 2/2]
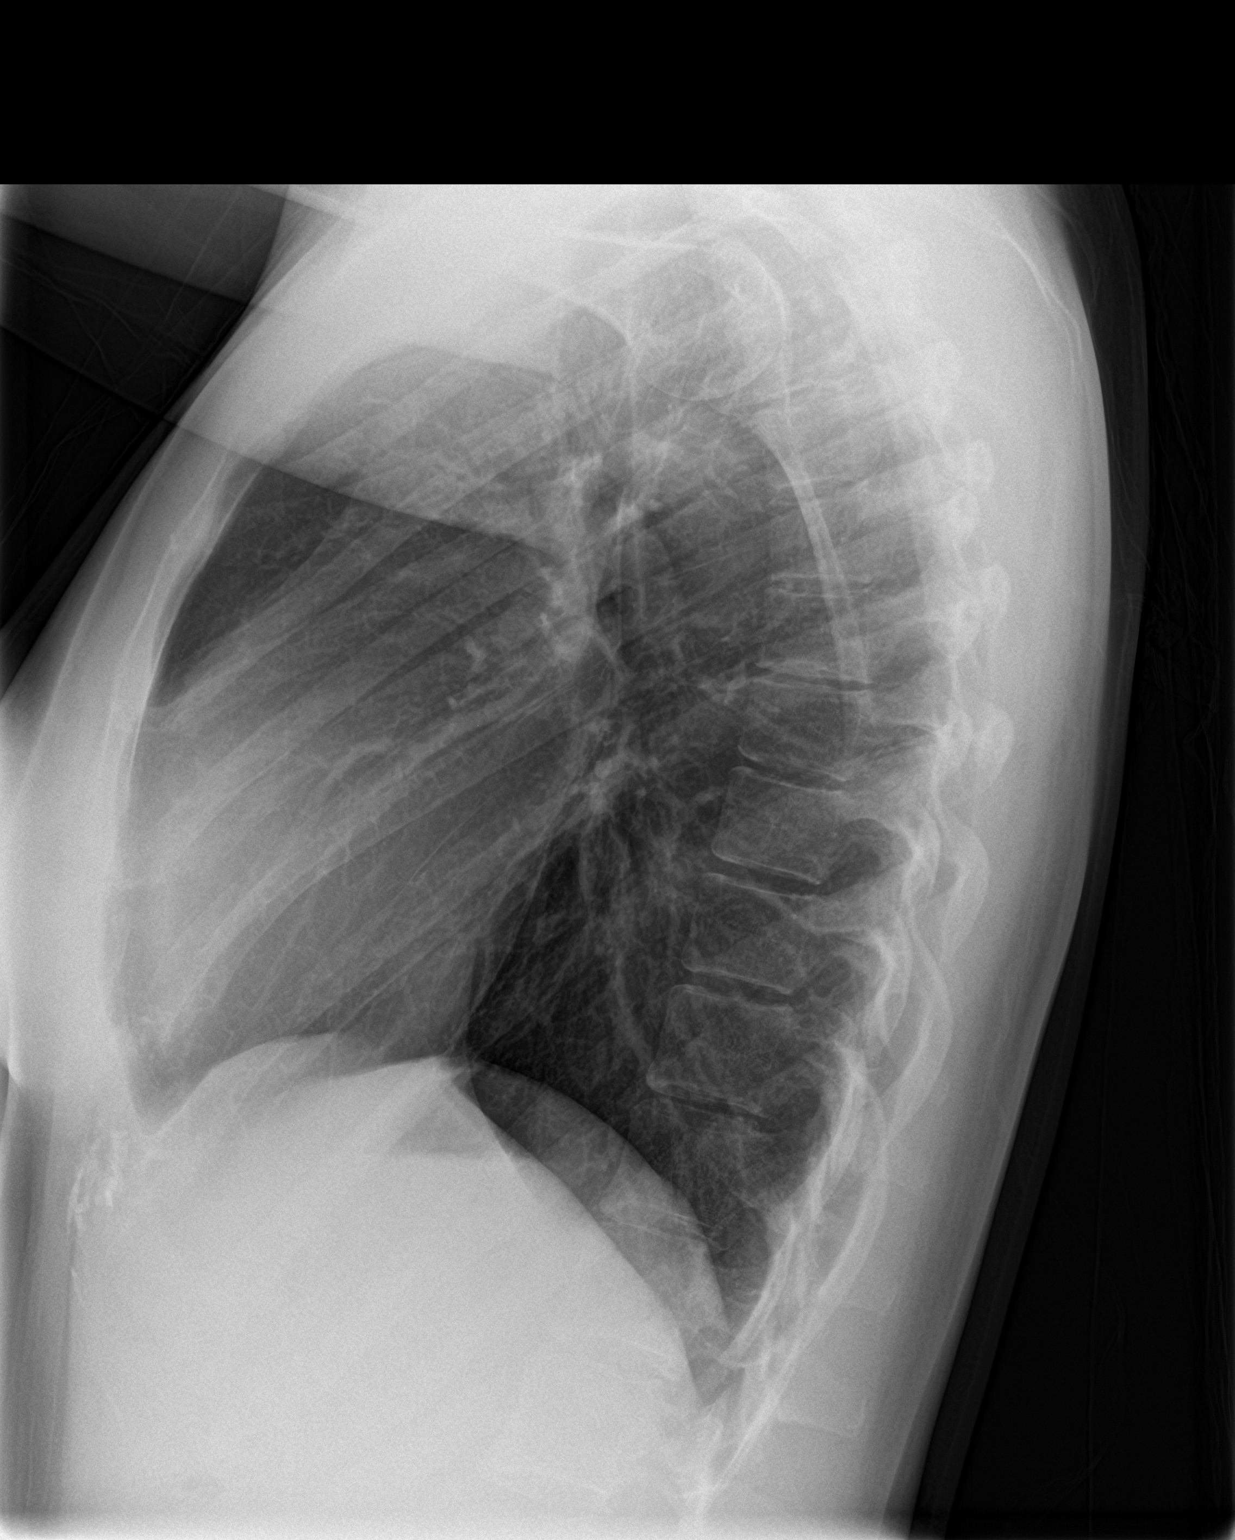

[2 of 2 positions shown; findings below may reference images not displayed]

FINDINGS: The heart size and mediastinal contours are within normal limits.
Both lungs are clear. The visualized skeletal structures are
unremarkable.
IMPRESSION: No active cardiopulmonary disease.

## 2019-06-01 DIAGNOSIS — R454 Irritability and anger: Secondary | ICD-10-CM | POA: Insufficient documentation

## 2019-06-08 DIAGNOSIS — F39 Unspecified mood [affective] disorder: Secondary | ICD-10-CM | POA: Insufficient documentation

## 2019-07-08 IMAGING — CR DG CHEST 2V
1 series · 2 of 2 positions shown · non-contrast
Comparison: PA and lateral chest 03/09/2017.

CLINICAL DATA: Chest tightness and anxiety for 1 week.

EXAM:
CHEST - 2 VIEW

[Series 1: dg chest 2 view · 0.14mm/px · 2 of 2 slices shown]
[im 1/2]
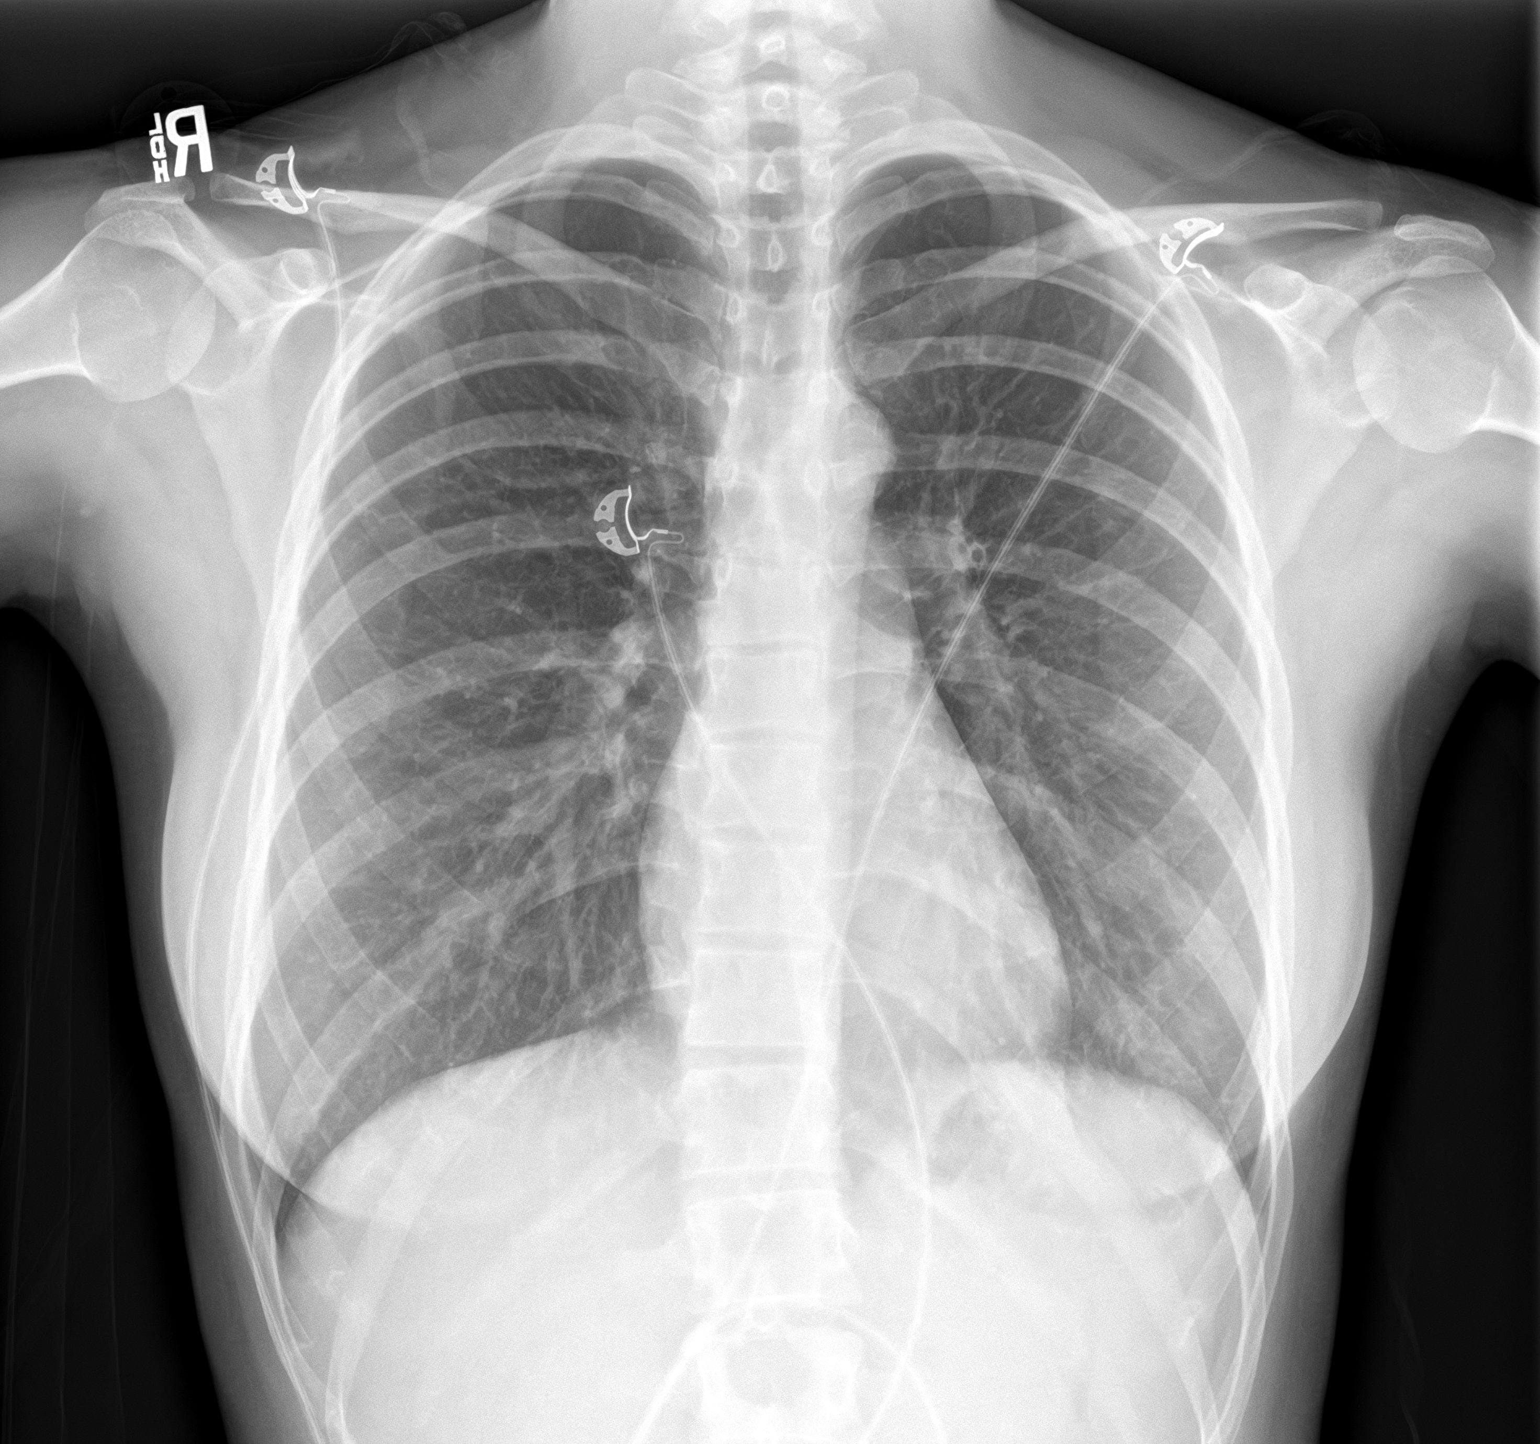
[im 2/2]
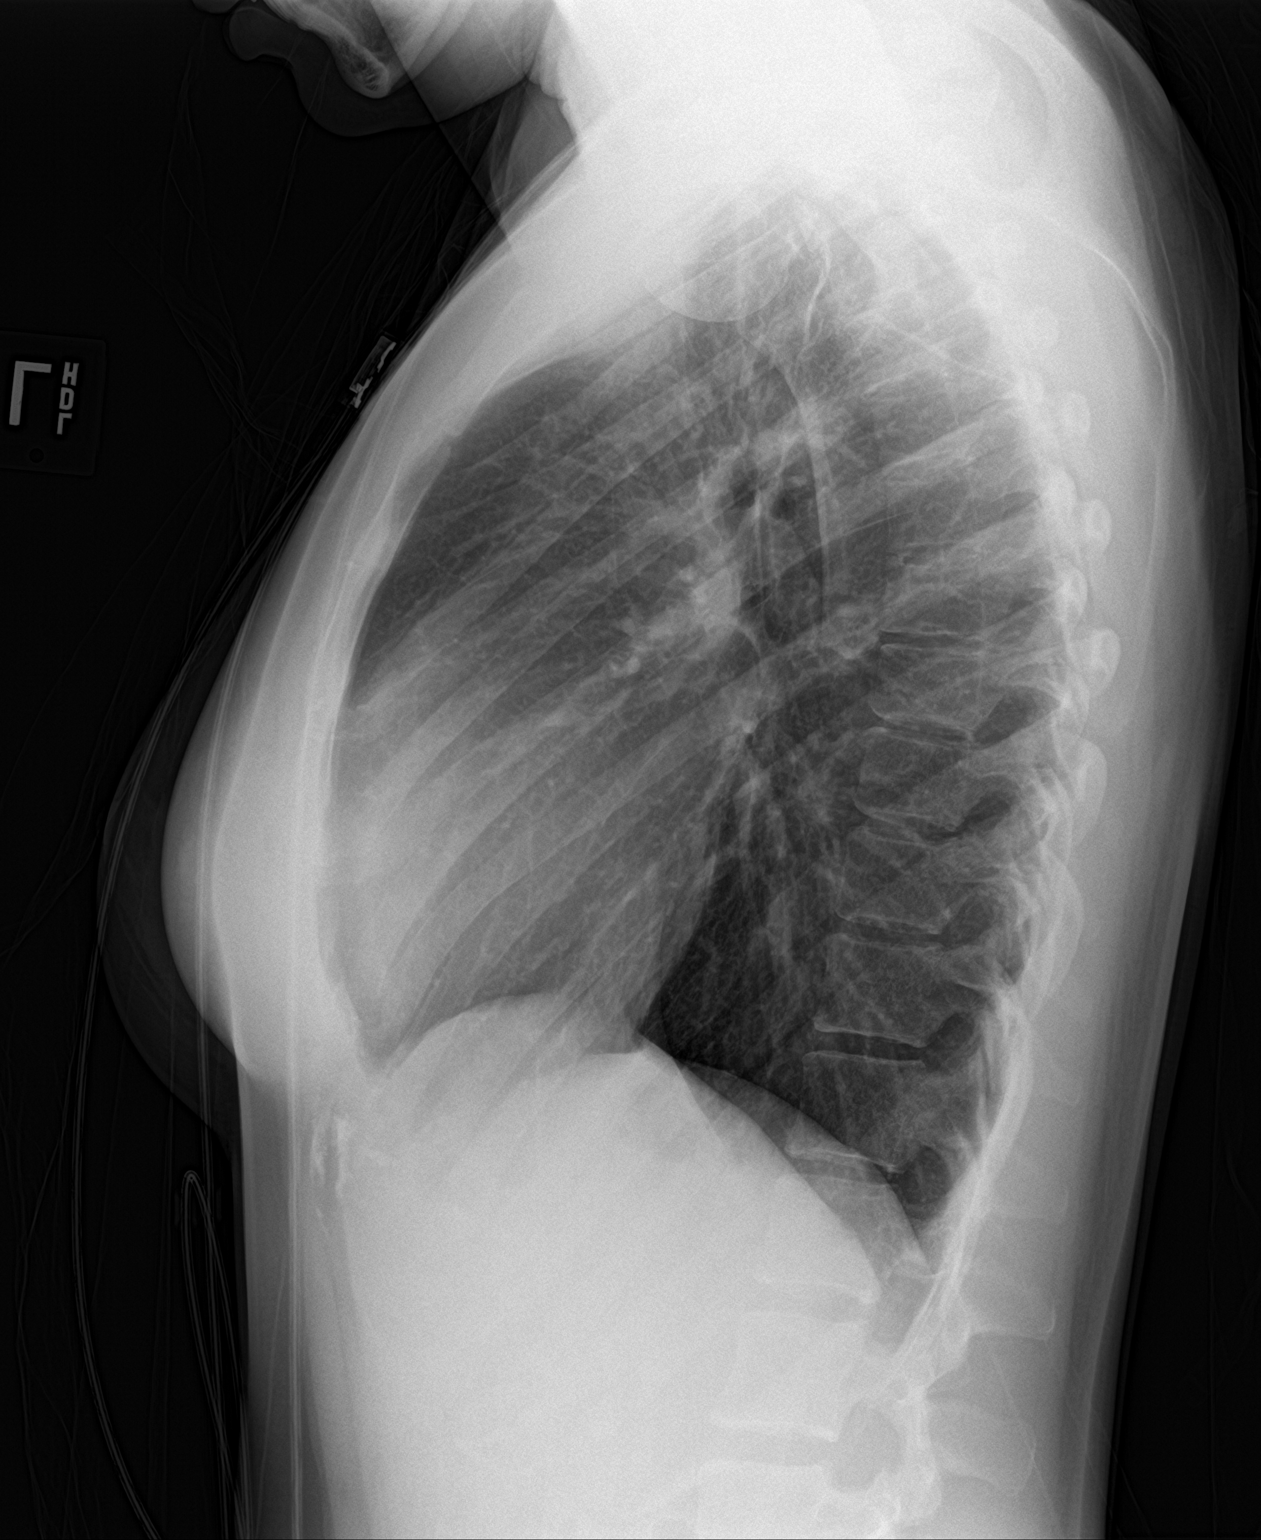

[2 of 2 positions shown; findings below may reference images not displayed]

FINDINGS: The lungs are clear. Heart size is normal. No pneumothorax or
pleural fluid. No bony abnormality
IMPRESSION: Normal chest.

## 2020-02-22 ENCOUNTER — Encounter: Payer: Self-pay | Admitting: Emergency Medicine

## 2020-02-22 ENCOUNTER — Other Ambulatory Visit: Payer: Self-pay

## 2020-02-22 ENCOUNTER — Ambulatory Visit
Admission: EM | Admit: 2020-02-22 | Discharge: 2020-02-22 | Disposition: A | Discharge status: Home / Self Care | Payer: Managed Care, Other (non HMO) | Attending: Family Medicine | Admitting: Family Medicine

## 2020-02-22 DIAGNOSIS — J039 Acute tonsillitis, unspecified: Secondary | ICD-10-CM | POA: Insufficient documentation

## 2020-02-22 LAB — GROUP A STREP BY PCR: Group A Strep by PCR: NOT DETECTED

## 2020-02-22 MED ORDER — AMOXICILLIN 875 MG PO TABS: 875.0000 mg | ORAL_TABLET | Freq: Two times a day (BID) | ORAL | 0 refills | Status: DC

## 2020-02-22 NOTE — ED Provider Notes (Signed)
[facility]    CSN: [id number] Arrival date & time: 02/22/20  1224      History   Chief Complaint Chief Complaint  Patient presents with  . Sore Throat    HPI Jennifer Harper is a 29 y.o. female.   29 yo female with a c/o sore throat since last night. States today she noticed white spots and patches on her tonsils. Denies any fevers, chills, trouble breathing or swallowing, runny nose, congestion, cough.    Sore Throat    Past Medical History:  Diagnosis Date  . Anxiety     There are no problems to display for this patient.   Past Surgical History:  Procedure Laterality Date  . BACK SURGERY      OB History   No obstetric history on file.      Home Medications    Prior to Admission medications   Medication Sig Start Date End Date Taking? Authorizing Provider  ARIPiprazole (ABILIFY) 5 MG tablet Take by mouth. 06/04/19  Yes [provider]  clonazePAM (KLONOPIN) 0.5 MG tablet Take 1 tablet (0.5 mg total) by mouth 2 (two) times daily as needed for anxiety. 03/27/18 02/22/20 Yes Schaevitz, Myra Rude, MD  etonogestrel-ethinyl estradiol (NUVARING) 0.12-0.015 MG/24HR vaginal ring Place 1 each vaginally every 28 (twenty-eight) days. Insert vaginally and leave in place for 3 consecutive weeks, then remove for 1 week.   Yes [provider]  sertraline (ZOLOFT) 50 MG tablet 1/2 tab PO qhs x1 week 1 tab PO qhs x2 weeks 2 tabs PO qhs as tolerated 03/27/18  Yes Schaevitz, Myra Rude, MD  amoxicillin (AMOXIL) 875 MG tablet Take 1 tablet (875 mg total) by mouth 2 (two) times daily. 02/22/20   Payton Mccallum, MD  benzonatate (TESSALON) 200 MG capsule Take one cap TID PRN cough Patient not taking: Reported on 03/27/2018 10/20/17   Lutricia Feil, PA-C  chlorpheniramine-HYDROcodone [facility] PENNKINETIC ER) 10-8 MG/5ML SUER Take 5 mLs by mouth 2 (two) times daily. Patient not taking: Reported on 03/27/2018 10/20/17   Lutricia Feil,  PA-C    Family History Family History  Problem Relation Age of Onset  . Healthy Mother   . Heart disease Father   . Hypertension Father     Social History Social History   Tobacco Use  . Smoking status: Former Smoker    Quit date: 2011    Years since quitting: 10.2  . Smokeless tobacco: Current User  . Tobacco comment: vapes  Substance Use Topics  . Alcohol use: Yes    Alcohol/week: 3.0 standard drinks    Types: 3 Glasses of wine per week  . Drug use: No     Allergies   Patient has no known allergies.   Review of Systems Review of Systems   Physical Exam Triage Vital Signs ED Triage Vitals  Enc Vitals Group     BP 02/22/20 1238 125/77     Pulse Rate 02/22/20 1238 78     Resp 02/22/20 1238 14     Temp 02/22/20 1238 98.4 F (36.9 C)     Temp Source 02/22/20 1238 Oral     SpO2 02/22/20 1238 99 %     Weight 02/22/20 1235 185 lb (83.9 kg)     Height 02/22/20 1235 5\' 5"  (1.651 m)     Head Circumference --      Peak Flow --      Pain Score 02/22/20 1235 6     Pain Loc --  Pain Edu? --      Excl. in [place]? --    No data found.  Updated Vital Signs BP 125/77 (BP Location: Right Arm)   Pulse 78   Temp 98.4 F (36.9 C) (Oral)   Resp 14   Ht 5\' 5"  (1.651 m)   Wt 83.9 kg   LMP 02/13/2020 (Approximate)   SpO2 99%   BMI 30.79 kg/m   Visual Acuity Right Eye Distance:   Left Eye Distance:   Bilateral Distance:    Right Eye Near:   Left Eye Near:    Bilateral Near:     Physical Exam Vitals and nursing note reviewed.  Constitutional:      General: She is not in acute distress.    Appearance: She is not toxic-appearing or diaphoretic.  HENT:     Right Ear: Tympanic membrane normal.     Left Ear: Tympanic membrane normal.     Mouth/Throat:     Pharynx: Uvula midline. Oropharyngeal exudate and posterior oropharyngeal erythema present. No uvula swelling.     Tonsils: Tonsillar exudate present. 2+ on the right. 2+ on the left.  Cardiovascular:      Rate and Rhythm: Normal rate.     Heart sounds: Normal heart sounds.  Pulmonary:     Breath sounds: Normal breath sounds.  Neurological:     Mental Status: She is alert.      UC Treatments / Results  Labs (all labs ordered are listed, but only abnormal results are displayed) Labs Reviewed  GROUP A STREP BY PCR    EKG   Radiology No results found.  Procedures Procedures (including critical care time)  Medications Ordered in UC Medications - No data to display  Initial Impression / Assessment and Plan / UC Course  I have reviewed the triage vital signs and the nursing notes.  Pertinent labs & imaging results that were available during my care of the patient were reviewed by me and considered in my medical decision making (see chart for details).      Final Clinical Impressions(s) / UC Diagnoses   Final diagnoses:  Tonsillitis     Discharge Instructions     Salt water gargles, tylenol/advil as needed    ED Prescriptions    Medication Sig Dispense Auth. Provider   amoxicillin (AMOXIL) 875 MG tablet Take 1 tablet (875 mg total) by mouth 2 (two) times daily. 20 tablet Norval Gable, MD     1. Lab results and diagnosis reviewed with patient 2. rx as per orders above; reviewed possible side effects, interactions, risks and benefits  3. Recommend supportive treatment as above 4. Follow-up prn if symptoms worsen or don't improve  PDMP not reviewed this encounter.   Norval Gable, MD 02/22/20 [phone]

## 2020-02-22 NOTE — ED Triage Notes (Signed)
Patient c/o sore throat that last night.  Patient states that she sees white spots on her tonsils. Patient denies fevers.  Patient reports a HA.

## 2020-02-22 NOTE — Discharge Instructions (Signed)
Salt water gargles, tylenol/advil as needed

## 2020-11-08 NOTE — L&D Delivery Note (Signed)
Delivery Summary for Jennifer Harper  Labor Events:   Preterm labor: No data found  Rupture date: 08/13/2021  Rupture time: 1:26 PM  Rupture type: Artificial Bulging bag of water  Fluid Color: Bloody Light Meconium  Induction: No data found  Augmentation: No data found  Complications: No data found  Cervical ripening: No data found No data found   No data found     Delivery:   Episiotomy: No data found  Lacerations: No data found  Repair suture: No data found  Repair # of packets: No data found  Blood loss (ml): 450   Information for the patient's newborn:  Shirline, Kendle [[id number]]   Delivery 08/14/2021 1:24 AM by  Vaginal, Spontaneous Sex:  female Gestational Age: [redacted]w[redacted]d Delivery Clinician:   Living?:         APGARS  One minute Five minutes Ten minutes  Skin color:        Heart rate:        Grimace:        Muscle tone:        Breathing:        Totals: 8  9      Presentation/position:      Resuscitation:   Cord information:    Disposition of cord blood:     Blood gases sent?  Complications:   Placenta: Delivered:       appearance Newborn Measurements: Weight: 7 lb 11.1 oz (3490 g)  Height: 21"  Head circumference:    Chest circumference:    Other providers:    Additional  information: Forceps:   Vacuum:   Breech:   Observed anomalies      Delivery Note At 1:24 AM a viable and healthy female was delivered via Vaginal, Spontaneous (Presentation: Left Occiput Anterior).  APGAR: 8, 9; weight 3490 grams.   Placenta status: Spontaneous,  intact.  Cord: 3 vessels with the following complications: None.  Cord pH: not obtained.  Delayed cord clamping observed.  Anesthesia: Epidural Episiotomy:  None Lacerations:  2nd degree perineal Suture Repair: 3.0 vicryl rapide Est. Blood Loss (mL):  450  Mom to postpartum.  Baby to Couplet care / Skin to Skin.  Hildred Laser, MD 08/14/2021, 1:53 AM

## 2020-12-17 ENCOUNTER — Encounter: Payer: Self-pay | Admitting: Certified Nurse Midwife

## 2020-12-17 ENCOUNTER — Other Ambulatory Visit: Payer: Self-pay

## 2020-12-17 ENCOUNTER — Ambulatory Visit (INDEPENDENT_AMBULATORY_CARE_PROVIDER_SITE_OTHER): Payer: PRIVATE HEALTH INSURANCE | Admitting: Certified Nurse Midwife

## 2020-12-17 VITALS — BP 104/76 | HR 103 | Ht 65.0 in | Wt 186.0 lb

## 2020-12-17 DIAGNOSIS — N926 Irregular menstruation, unspecified: Secondary | ICD-10-CM | POA: Diagnosis not present

## 2020-12-17 LAB — POCT URINE PREGNANCY: Preg Test, Ur: POSITIVE — AB

## 2020-12-17 NOTE — Progress Notes (Signed)
Subjective:    Jennifer Harper is a 30 y.o. female who presents for evaluation of amenorrhea. She believes she could be pregnant. Pregnancy is desired. Sexual Activity: single partner, contraception: none. Current symptoms also include: headache. Last period was normal.   Patient's last menstrual period was 11/04/2020 (exact date). The following portions of the patient's history were reviewed and updated as appropriate: allergies, current medications, past family history, past medical history, past social history, past surgical history and problem list.  Review of Systems Pertinent items are noted in HPI.     Objective:    BP 104/76   Pulse (!) 103   Ht 5\' 5"  (1.651 m)   Wt 186 lb (84.4 kg)   LMP 11/04/2020 (Exact Date)   BMI 30.95 kg/m  General: alert, cooperative, appears stated age, mild distress and no acute distress    Lab Review Urine HCG: positive    Assessment:    Absence of menstruation.     Plan:     Positive: EDC: 09/11/21. Briefly discussed pre-natal care options. MD or midwifery care reviewed. Pt plans to see midwives. Encouraged well-balanced diet, plenty of rest when needed, pre-natal vitamins daily and walking for exercise. Discussed self-help for nausea, avoiding OTC medications until consulting provider or pharmacist, other than Tylenol as needed, minimal caffeine (1-2 cups daily) and avoiding alcohol. She will schedule her u/s for 2 wks, her nurse visit for [redacted] wks pregnant , and  initial OB visit @ [redacted] wks pregnant. Feel free to call with any questions.   13/4/22, CNM

## 2020-12-17 NOTE — Patient Instructions (Signed)
https://www.[facility].org/womens-health/faqs/prenatal-genetic-screening-tests">  Prenatal Care Prenatal care is health care during pregnancy. It helps you and your unborn baby (fetus) stay as healthy as possible. Prenatal care may be provided by a midwife, a family practice doctor, a mid-level practitioner (nurse practitioner or physician assistant), or a childbirth and pregnancy doctor (obstetrician). How does this affect me? During pregnancy, you will be closely monitored for any new conditions that might develop. To lower your risk of pregnancy complications, you and your health care provider will talk about any underlying conditions you have. How does this affect my baby? Early and consistent prenatal care increases the chance that your baby will be healthy during pregnancy. Prenatal care lowers the risk that your baby will be:  Born early (prematurely).  Smaller than expected at birth (small for gestational age). What can I expect at the first prenatal care visit? Your first prenatal care visit will likely be the longest. You should schedule your first prenatal care visit as soon as you know that you are pregnant. Your first visit is a good time to talk about any questions or concerns you have about pregnancy. Medical history At your visit, you and your health care provider will talk about your medical history, including:  Any past pregnancies.  Your family's medical history.  Medical history of the baby's father.  Any long-term (chronic) health conditions you have and how you manage them.  Any surgeries or procedures you have had.  Any current over-the-counter or prescription medicines, herbs, or supplements that you are taking.  Other factors that could pose a risk to your baby, including: ? Exposure to harmful chemicals or radiation at work or at home. ? Any substance use, including tobacco, alcohol, and drug use.  Your home setting and your stress levels, including: ? Exposure to  abuse or violence. ? Household financial strain.  Your daily health habits, including diet and exercise. Tests and screenings Your health care provider will:  Measure your weight, height, and blood pressure.  Do a physical exam, including a pelvic and breast exam.  Perform blood tests and urine tests to check for: ? Urinary tract infection. ? Sexually transmitted infections (STIs). ? Low iron levels in your blood (anemia). ? Blood type and certain proteins on red blood cells (Rh antibodies). ? Infections and immunity to viruses, such as hepatitis B and rubella. ? HIV (human immunodeficiency virus).  Discuss your options for genetic screening. Tips about staying healthy Your health care provider will also give you information about how to keep yourself and your baby healthy, including:  Nutrition and taking vitamins.  Physical activity.  How to manage pregnancy symptoms such as nausea and vomiting (morning sickness).  Infections and substances that may be harmful to your baby and how to avoid them.  Food safety.  Dental care.  Working.  Travel.  Warning signs to watch for and when to call your health care provider. How often will I have prenatal care visits? After your first prenatal care visit, you will have regular visits throughout your pregnancy. The visit schedule is often as follows:  Up to week 28 of pregnancy: once every 4 weeks.  28-36 weeks: once every 2 weeks.  After 36 weeks: every week until delivery. Some women may have visits more or less often depending on any underlying health conditions and the health of the baby. Keep all follow-up and prenatal care visits. This is important. What happens during routine prenatal care visits? Your health care provider will:  Measure your weight   and blood pressure.  Check for fetal heart sounds.  Measure the height of your uterus in your abdomen (fundal height). This may be measured starting around week 20 of  pregnancy.  Check the position of your baby inside your uterus.  Ask questions about your diet, sleeping patterns, and whether you can feel the baby move.  Review warning signs to watch for and signs of labor.  Ask about any pregnancy symptoms you are having and how you are dealing with them. Symptoms may include: ? Headaches. ? Nausea and vomiting. ? Vaginal discharge. ? Swelling. ? Fatigue. ? Constipation. ? Changes in your vision. ? Feeling persistently sad or anxious. ? Any discomfort, including back or pelvic pain. ? Bleeding or spotting. Make a list of questions to ask your health care provider at your routine visits.   What tests might I have during prenatal care visits? You may have blood, urine, and imaging tests throughout your pregnancy, such as:  Urine tests to check for glucose, protein, or signs of infection.  Glucose tests to check for a form of diabetes that can develop during pregnancy (gestational diabetes mellitus). This is usually done around week 24 of pregnancy.  Ultrasounds to check your baby's growth and development, to check for birth defects, and to check your baby's well-being. These can also help to decide when you should deliver your baby.  A test to check for group B strep (GBS) infection. This is usually done around week 36 of pregnancy.  Genetic testing. This may include blood, fluid, or tissue sampling, or imaging tests, such as an ultrasound. Some genetic tests are done during the first trimester and some are done during the second trimester. What else can I expect during prenatal care visits? Your health care provider may recommend getting certain vaccines during pregnancy. These may include:  A yearly flu shot (annual influenza vaccine). This is especially important if you will be pregnant during flu season.  Tdap (tetanus, diphtheria, pertussis) vaccine. Getting this vaccine during pregnancy can protect your baby from whooping cough  (pertussis) after birth. This vaccine may be recommended between weeks 27 and 36 of pregnancy.  A COVID-19 vaccine. Later in your pregnancy, your health care provider may give you information about:  Childbirth and breastfeeding classes.  Choosing a health care provider for your baby.  Umbilical cord banking.  Breastfeeding.  Birth control after your baby is born.  The hospital labor and delivery unit and how to set up a tour.  Registering at the hospital before you go into labor. Where to find more information  Office on Women's Health: womenshealth.gov  American Pregnancy Association: americanpregnancy.org  March of Dimes: marchofdimes.org Summary  Prenatal care helps you and your baby stay as healthy as possible during pregnancy.  Your first prenatal care visit will most likely be the longest.  You will have visits and tests throughout your pregnancy to monitor your health and your baby's health.  Bring a list of questions to your visits to ask your health care provider.  Make sure to keep all follow-up and prenatal care visits. This information is not intended to replace advice given to you by your health care provider. Make sure you discuss any questions you have with your health care provider. Document Revised: 08/07/2020 Document Reviewed: 08/07/2020 Elsevier Patient Education  2021 [employer]  

## 2021-01-01 ENCOUNTER — Other Ambulatory Visit: Payer: 59

## 2021-01-02 ENCOUNTER — Other Ambulatory Visit: Payer: 59

## 2021-01-05 ENCOUNTER — Telehealth: Payer: Self-pay | Admitting: Certified Nurse Midwife

## 2021-01-05 NOTE — Telephone Encounter (Signed)
error 

## 2021-01-16 ENCOUNTER — Other Ambulatory Visit: Payer: Self-pay

## 2021-01-16 ENCOUNTER — Ambulatory Visit (INDEPENDENT_AMBULATORY_CARE_PROVIDER_SITE_OTHER): Payer: PRIVATE HEALTH INSURANCE

## 2021-01-16 VITALS — BP 107/69 | HR 76 | Ht 65.0 in | Wt 183.5 lb

## 2021-01-16 DIAGNOSIS — Z113 Encounter for screening for infections with a predominantly sexual mode of transmission: Secondary | ICD-10-CM | POA: Diagnosis not present

## 2021-01-16 DIAGNOSIS — Z3401 Encounter for supervision of normal first pregnancy, first trimester: Secondary | ICD-10-CM

## 2021-01-16 DIAGNOSIS — R638 Other symptoms and signs concerning food and fluid intake: Secondary | ICD-10-CM

## 2021-01-16 DIAGNOSIS — Z0283 Encounter for blood-alcohol and blood-drug test: Secondary | ICD-10-CM

## 2021-01-16 LAB — OB RESULTS CONSOLE VARICELLA ZOSTER ANTIBODY, IGG: Varicella: IMMUNE

## 2021-01-16 LAB — OB RESULTS CONSOLE GC/CHLAMYDIA: Gonorrhea: NEGATIVE

## 2021-01-16 NOTE — Patient Instructions (Signed)
WHAT OB PATIENTS CAN EXPECT   Confirmation of pregnancy and ultrasound ordered if medically indicated-[redacted] weeks gestation  New OB (NOB) intake with nurse and New OB (NOB) labs- [redacted] weeks gestation  New OB (NOB) physical examination with provider- 11/[redacted] weeks gestation  Flu vaccine-[redacted] weeks gestation  Anatomy scan-[redacted] weeks gestation  Glucose tolerance test, blood work to test for anemia, T-dap vaccine-[redacted] weeks gestation  Vaginal swabs/cultures-STD/Group B strep-[redacted] weeks gestation  Appointments every 4 weeks until 28 weeks  Every 2 weeks from 28 weeks until 36 weeks  Weekly visits from 36 weeks until delivery  [email or web address]  Prenatal Care Prenatal care is health care during pregnancy. It helps you and your unborn baby (fetus) stay as healthy as possible. Prenatal care may be provided by a midwife, a family practice doctor, a mid-level practitioner (nurse practitioner or physician assistant), or a childbirth and pregnancy doctor (obstetrician). How does this affect me? During pregnancy, you will be closely monitored for any new conditions that might develop. To lower your risk of pregnancy complications, you and your health care provider will talk about any underlying conditions you have. How does this affect my baby? Early and consistent prenatal care increases the chance that your baby will be healthy during pregnancy. Prenatal care lowers the risk that your baby will be:  Born early (prematurely).  Smaller than expected at birth (small for gestational age). What can I expect at the first prenatal care visit? Your first prenatal care visit will likely be the longest. You should schedule your first prenatal care visit as soon as you know that you are pregnant. Your first visit is a good time to talk about any questions or concerns you have about pregnancy. Medical history At your visit, you and your health care provider will  talk about your medical history, including:  Any past pregnancies.  Your family's medical history.  Medical history of the baby's father.  Any long-term (chronic) health conditions you have and how you manage them.  Any surgeries or procedures you have had.  Any current over-the-counter or prescription medicines, herbs, or supplements that you are taking.  Other factors that could pose a risk to your baby, including: ? Exposure to harmful chemicals or radiation at work or at home. ? Any substance use, including tobacco, alcohol, and drug use.  Your home setting and your stress levels, including: ? Exposure to abuse or violence. ? Household financial strain.  Your daily health habits, including diet and exercise. Tests and screenings Your health care provider will:  Measure your weight, height, and blood pressure.  Do a physical exam, including a pelvic and breast exam.  Perform blood tests and urine tests to check for: ? Urinary tract infection. ? Sexually transmitted infections (STIs). ? Low iron levels in your blood (anemia). ? Blood type and certain proteins on red blood cells (Rh antibodies). ? Infections and immunity to viruses, such as hepatitis B and rubella. ? HIV (human immunodeficiency virus).  Discuss your options for genetic screening. Tips about staying healthy Your health care provider will also give you information about how to keep yourself and your baby healthy, including:  Nutrition and taking vitamins.  Physical activity.  How to manage pregnancy symptoms such as nausea and vomiting (morning sickness).  Infections and substances that may be harmful to your baby and how to avoid them.  Food safety.  Dental care.  Working.  Travel.  Warning signs to watch for and when to call your health care provider.   How often will I have prenatal care visits? After your first prenatal care visit, you will have regular visits throughout your pregnancy.  The visit schedule is often as follows:  Up to week 28 of pregnancy: once every 4 weeks.  28-36 weeks: once every 2 weeks.  After 36 weeks: every week until delivery. Some women may have visits more or less often depending on any underlying health conditions and the health of the baby. Keep all follow-up and prenatal care visits. This is important. What happens during routine prenatal care visits? Your health care provider will:  Measure your weight and blood pressure.  Check for fetal heart sounds.  Measure the height of your uterus in your abdomen (fundal height). This may be measured starting around week 20 of pregnancy.  Check the position of your baby inside your uterus.  Ask questions about your diet, sleeping patterns, and whether you can feel the baby move.  Review warning signs to watch for and signs of labor.  Ask about any pregnancy symptoms you are having and how you are dealing with them. Symptoms may include: ? Headaches. ? Nausea and vomiting. ? Vaginal discharge. ? Swelling. ? Fatigue. ? Constipation. ? Changes in your vision. ? Feeling persistently sad or anxious. ? Any discomfort, including back or pelvic pain. ? Bleeding or spotting. Make a list of questions to ask your health care provider at your routine visits.   What tests might I have during prenatal care visits? You may have blood, urine, and imaging tests throughout your pregnancy, such as:  Urine tests to check for glucose, protein, or signs of infection.  Glucose tests to check for a form of diabetes that can develop during pregnancy (gestational diabetes mellitus). This is usually done around week 24 of pregnancy.  Ultrasounds to check your baby's growth and development, to check for birth defects, and to check your baby's well-being. These can also help to decide when you should deliver your baby.  A test to check for group B strep (GBS) infection. This is usually done around week 36 of  pregnancy.  Genetic testing. This may include blood, fluid, or tissue sampling, or imaging tests, such as an ultrasound. Some genetic tests are done during the first trimester and some are done during the second trimester. What else can I expect during prenatal care visits? Your health care provider may recommend getting certain vaccines during pregnancy. These may include:  A yearly flu shot (annual influenza vaccine). This is especially important if you will be pregnant during flu season.  Tdap (tetanus, diphtheria, pertussis) vaccine. Getting this vaccine during pregnancy can protect your baby from whooping cough (pertussis) after birth. This vaccine may be recommended between weeks 27 and 36 of pregnancy.  A COVID-19 vaccine. Later in your pregnancy, your health care provider may give you information about:  Childbirth and breastfeeding classes.  Choosing a health care provider for your baby.  Umbilical cord banking.  Breastfeeding.  Birth control after your baby is born.  The hospital labor and delivery unit and how to set up a tour.  Registering at the hospital before you go into labor. Where to find more information  Office on Women's Health: womenshealth.gov  American Pregnancy Association: americanpregnancy.org  March of Dimes: marchofdimes.org Summary  Prenatal care helps you and your baby stay as healthy as possible during pregnancy.  Your first prenatal care visit will most likely be the longest.  You will have visits and tests throughout your pregnancy to monitor   your health and your baby's health.  Bring a list of questions to your visits to ask your health care provider.  Make sure to keep all follow-up and prenatal care visits. This information is not intended to replace advice given to you by your health care provider. Make sure you discuss any questions you have with your health care provider. Document Revised: 08/07/2020 Document Reviewed:  08/07/2020 Elsevier Patient Education  2021 [employer] Inc. Morning Sickness  Morning sickness is when you feel like you may vomit (feel nauseous) during pregnancy. Sometimes, you may vomit. Morning sickness most often happens in the morning, but it can also happen at any time of the day. Some women may have morning sickness that makes them vomit all the time. This is a more serious problem that needs treatment. What are the causes? The cause of this condition is not known. What increases the risk?  You had vomiting or a feeling like you may vomit before your pregnancy.  You had morning sickness in another pregnancy.  You are pregnant with more than one baby, such as twins. What are the signs or symptoms?  Feeling like you may vomit.  Vomiting. How is this treated? Treatment is usually not needed for this condition. You may only need to change what you eat. In some cases, your doctor may give you some things to take for your condition. These include:  Vitamin B6 supplements.  Medicines to treat the feeling that you may vomit.  Ginger. Follow these instructions at home: Medicines  Take over-the-counter and prescription medicines only as told by your doctor. Do not take any medicines until you talk with your doctor about them first.  Take multivitamins before you get pregnant. These can stop or lessen the symptoms of morning sickness. Eating and drinking  Eat dry toast or crackers before getting out of bed.  Eat 5 or 6 small meals a day.  Eat dry and bland foods like rice and baked potatoes.  Do not eat greasy, fatty, or spicy foods.  Have someone cook for you if the smell of food causes you to vomit or to feel like you may vomit.  If you feel like you may vomit after taking prenatal vitamins, take them at night or with a snack.  Eat protein foods when you need a snack. Nuts, yogurt, and cheese are good choices.  Drink fluids throughout the day.  Try ginger ale made  with real ginger, ginger tea made from fresh grated ginger, or ginger candies. General instructions  Do not smoke or use any products that contain nicotine or tobacco. If you need help quitting, ask your doctor.  Use an air purifier to keep the air in your house free of smells.  Get lots of fresh air.  Try to avoid smells that make you feel sick.  Try wearing an acupressure wristband. This is a wristband that is used to treat seasickness.  Try a treatment called acupuncture. In this treatment, a doctor puts needles into certain areas of your body to make you feel better. Contact a doctor if:  You need medicine to feel better.  You feel dizzy or light-headed.  You are losing weight. Get help right away if:  The feeling that you may vomit will not go away, or you cannot stop vomiting.  You faint.  You have very bad pain in your belly. Summary  Morning sickness is when you feel like you may vomit (feel nauseous) during pregnancy.  You may feel sick   in the morning, but you can feel this way at any time of the day.  Making some changes to what you eat may help your symptoms go away. This information is not intended to replace advice given to you by your health care provider. Make sure you discuss any questions you have with your health care provider. Document Revised: 06/09/2020 Document Reviewed: 05/19/2020 Elsevier Patient Education  2021 Elsevier Inc. https://www.cdc.gov/pregnancy/infections.html">  First Trimester of Pregnancy  The first trimester of pregnancy starts on the first day of your last menstrual period until the end of week 12. This is also called months 1 through 3 of pregnancy. Body changes during your first trimester Your body goes through many changes during pregnancy. The changes usually return to normal after your baby is born. Physical changes  You may gain or lose weight.  Your breasts may grow larger and hurt. The area around your nipples may get  darker.  Dark spots or blotches may develop on your face.  You may have changes in your hair. Health changes  You may feel like you might vomit (nauseous), and you may vomit.  You may have heartburn.  You may have headaches.  You may have trouble pooping (constipation).  Your gums may bleed. Other changes  You may get tired easily.  You may pee (urinate) more often.  Your menstrual periods will stop.  You may not feel hungry.  You may want to eat certain kinds of food.  You may have changes in your emotions from day to day.  You may have more dreams. Follow these instructions at home: Medicines  Take over-the-counter and prescription medicines only as told by your doctor. Some medicines are not safe during pregnancy.  Take a prenatal vitamin that contains at least 600 micrograms (mcg) of folic acid. Eating and drinking  Eat healthy meals that include: ? Fresh fruits and vegetables. ? Whole grains. ? Good sources of protein, such as meat, eggs, or tofu. ? Low-fat dairy products.  Avoid raw meat and unpasteurized juice, milk, and cheese.  If you feel like you may vomit, or you vomit: ? Eat 4 or 5 small meals a day instead of 3 large meals. ? Try eating a few soda crackers. ? Drink liquids between meals instead of during meals.  You may need to take these actions to prevent or treat trouble pooping: ? Drink enough fluids to keep your pee (urine) pale yellow. ? Eat foods that are high in fiber. These include beans, whole grains, and fresh fruits and vegetables. ? Limit foods that are high in fat and sugar. These include fried or sweet foods. Activity  Exercise only as told by your doctor. Most people can do their usual exercise routine during pregnancy.  Stop exercising if you have cramps or pain in your lower belly (abdomen) or low back.  Do not exercise if it is too hot or too humid, or if you are in a place of great height (high altitude).  Avoid heavy  lifting.  If you choose to, you may have sex unless your doctor tells you not to. Relieving pain and discomfort  Wear a good support bra if your breasts are sore.  Rest with your legs raised (elevated) if you have leg cramps or low back pain.  If you have bulging veins (varicose veins) in your legs: ? Wear support hose as told by your doctor. ? Raise your feet for 15 minutes, 3-4 times a day. ? Limit salt in your   food. Safety  Wear your seat belt at all times when you are in a car.  Talk with your doctor if someone is hurting you or yelling at you.  Talk with your doctor if you are feeling sad or have thoughts of hurting yourself. Lifestyle  Do not use hot tubs, steam rooms, or saunas.  Do not douche. Do not use tampons or scented sanitary pads.  Do not use herbal medicines, illegal drugs, or medicines that are not approved by your doctor. Do not drink alcohol.  Do not smoke or use any products that contain nicotine or tobacco. If you need help quitting, ask your doctor.  Avoid cat litter boxes and soil that is used by cats. These carry germs that can cause harm to the baby and can cause a loss of your baby by miscarriage or stillbirth. General instructions  Keep all follow-up visits. This is important.  Ask for help if you need counseling or if you need help with nutrition. Your doctor can give you advice or tell you where to go for help.  Visit your dentist. At home, brush your teeth with a soft toothbrush. Floss gently.  Write down your questions. Take them to your prenatal visits. Where to find more information  American Pregnancy Association: americanpregnancy.org  American College of Obstetricians and Gynecologists: www.acog.org  Office on Women's Health: womenshealth.gov/pregnancy Contact a doctor if:  You are dizzy.  You have a fever.  You have mild cramps or pressure in your lower belly.  You have a nagging pain in your belly area.  You continue to  feel like you may vomit, you vomit, or you have watery poop (diarrhea) for 24 hours or longer.  You have a bad-smelling fluid coming from your vagina.  You have pain when you pee.  You are exposed to a disease that spreads from person to person, such as chickenpox, measles, Zika virus, HIV, or hepatitis. Get help right away if:  You have spotting or bleeding from your vagina.  You have very bad belly cramping or pain.  You have shortness of breath or chest pain.  You have any kind of injury, such as from a fall or a car crash.  You have new or increased pain, swelling, or redness in an arm or leg. Summary  The first trimester of pregnancy starts on the first day of your last menstrual period until the end of week 12 (months 1 through 3).  Eat 4 or 5 small meals a day instead of 3 large meals.  Do not smoke or use any products that contain nicotine or tobacco. If you need help quitting, ask your doctor.  Keep all follow-up visits. This information is not intended to replace advice given to you by your health care provider. Make sure you discuss any questions you have with your health care provider. Document Revised: 04/02/2020 Document Reviewed: 02/07/2020 Elsevier Patient Education  2021 Elsevier Inc. Commonly Asked Questions During Pregnancy  Cats: A parasite can be excreted in cat feces.  To avoid exposure you need to have another person empty the little box.  If you must empty the litter box you will need to wear gloves.  Wash your hands after handling your cat.  This parasite can also be found in raw or undercooked meat so this should also be avoided.  Colds, Sore Throats, Flu: Please check your medication sheet to see what you can take for symptoms.  If your symptoms are unrelieved by these medications please call   the office.  Dental Work: Most any dental work your dentist recommends is permitted.  X-rays should only be taken during the first trimester if absolutely  necessary.  Your abdomen should be shielded with a lead apron during all x-rays.  Please notify your provider prior to receiving any x-rays.  Novocaine is fine; gas is not recommended.  If your dentist requires a note from us prior to dental work please call the office and we will provide one for you.  Exercise: Exercise is an important part of staying healthy during your pregnancy.  You may continue most exercises you were accustomed to prior to pregnancy.  Later in your pregnancy you will most likely notice you have difficulty with activities requiring balance like riding a bicycle.  It is important that you listen to your body and avoid activities that put you at a higher risk of falling.  Adequate rest and staying well hydrated are a must!  If you have questions about the safety of specific activities ask your provider.    Exposure to Children with illness: Try to avoid obvious exposure; report any symptoms to us when noted,  If you have chicken pos, red measles or mumps, you should be immune to these diseases.   Please do not take any vaccines while pregnant unless you have checked with your OB provider.  Fetal Movement: After 28 weeks we recommend you do "kick counts" twice daily.  Lie or sit down in a calm quiet environment and count your baby movements "kicks".  You should feel your baby at least 10 times per hour.  If you have not felt 10 kicks within the first hour get up, walk around and have something sweet to eat or drink then repeat for an additional hour.  If count remains less than 10 per hour notify your provider.  Fumigating: Follow your pest control agent's advice as to how long to stay out of your home.  Ventilate the area well before re-entering.  Hemorrhoids:   Most over-the-counter preparations can be used during pregnancy.  Check your medication to see what is safe to use.  It is important to use a stool softener or fiber in your diet and to drink lots of liquids.  If hemorrhoids  seem to be getting worse please call the office.   Hot Tubs:  Hot tubs Jacuzzis and saunas are not recommended while pregnant.  These increase your internal body temperature and should be avoided.  Intercourse:  Sexual intercourse is safe during pregnancy as long as you are comfortable, unless otherwise advised by your provider.  Spotting may occur after intercourse; report any bright red bleeding that is heavier than spotting.  Labor:  If you know that you are in labor, please go to the hospital.  If you are unsure, please call the office and let us help you decide what to do.  Lifting, straining, etc:  If your job requires heavy lifting or straining please check with your provider for any limitations.  Generally, you should not lift items heavier than that you can lift simply with your hands and arms (no back muscles)  Painting:  Paint fumes do not harm your pregnancy, but may make you ill and should be avoided if possible.  Latex or water based paints have less odor than oils.  Use adequate ventilation while painting.  Permanents & Hair Color:  Chemicals in hair dyes are not recommended as they cause increase hair dryness which can increase hair loss during pregnancy.  "   Highlighting" and permanents are allowed.  Dye may be absorbed differently and permanents may not hold as well during pregnancy.  Sunbathing:  Use a sunscreen, as skin burns easily during pregnancy.  Drink plenty of fluids; avoid over heating.  Tanning Beds:  Because their possible side effects are still unknown, tanning beds are not recommended.  Ultrasound Scans:  Routine ultrasounds are performed at approximately 20 weeks.  You will be able to see your baby's general anatomy an if you would like to know the gender this can usually be determined as well.  If it is questionable when you conceived you may also receive an ultrasound early in your pregnancy for dating purposes.  Otherwise ultrasound exams are not routinely  performed unless there is a medical necessity.  Although you can request a scan we ask that you pay for it when conducted because insurance does not cover " patient request" scans.  Work: If your pregnancy proceeds without complications you may work until your due date, unless your physician or employer advises otherwise.  Round Ligament Pain/Pelvic Discomfort:  Sharp, shooting pains not associated with bleeding are fairly common, usually occurring in the second trimester of pregnancy.  They tend to be worse when standing up or when you remain standing for long periods of time.  These are the result of pressure of certain pelvic ligaments called "round ligaments".  Rest, Tylenol and heat seem to be the most effective relief.  As the womb and fetus grow, they rise out of the pelvis and the discomfort improves.  Please notify the office if your pain seems different than that described.  It may represent a more serious condition.  Common Medications Safe in Pregnancy  Acne:      Constipation:  Benzoyl Peroxide     Colace  Clindamycin      Dulcolax Suppository  Topica Erythromycin     Fibercon  Salicylic Acid      Metamucil         Miralax AVOID:        Senakot   Accutane    Cough:  Retin-A       Cough Drops  Tetracycline      Phenergan w/ Codeine if Rx  Minocycline      Robitussin (Plain & DM)  Antibiotics:     Crabs/Lice:  Ceclor       RID  Cephalosporins    AVOID:  E-Mycins      Kwell  Keflex  Macrobid/Macrodantin   Diarrhea:  Penicillin      Kao-Pectate  Zithromax      Imodium AD         PUSH FLUIDS AVOID:       Cipro     Fever:  Tetracycline      Tylenol (Regular or Extra  Minocycline       Strength)  Levaquin      Extra Strength-Do not          Exceed 8 tabs/24 hrs Caffeine:        <200mg/day (equiv. To 1 cup of coffee or  approx. 3 12 oz  sodas)         Gas: Cold/Hayfever:       Gas-X  Benadryl      Mylicon  Claritin       Phazyme  **Claritin-D        Chlor-Trimeton    Headaches:  Dimetapp      ASA-Free Excedrin  Drixoral-Non-Drowsy     Cold Compress    Mucinex (Guaifenasin)     Tylenol (Regular or Extra  Sudafed/Sudafed-12 Hour     Strength)  **Sudafed PE Pseudoephedrine   Tylenol Cold & Sinus     Vicks Vapor Rub  Zyrtec  **AVOID if Problems With Blood Pressure         Heartburn: Avoid lying down for at least 1 hour after meals  Aciphex      Maalox     Rash:  Milk of Magnesia     Benadryl    Mylanta       1% Hydrocortisone Cream  Pepcid  Pepcid Complete   Sleep Aids:  Prevacid      Ambien   Prilosec       Benadryl  Rolaids       Chamomile Tea  Tums (Limit 4/day)     Unisom         Tylenol PM         Warm milk-add vanilla or  Hemorrhoids:       Sugar for taste  Anusol/Anusol H.C.  (RX: Analapram 2.5%)  Sugar Substitutes:  Hydrocortisone OTC     Ok in moderation  Preparation H      Tucks        Vaseline lotion applied to tissue with wiping    Herpes:     Throat:  Acyclovir      Oragel  Famvir  Valtrex     Vaccines:         Flu Shot Leg Cramps:       *Gardasil  Benadryl      Hepatitis A         Hepatitis B Nasal Spray:       Pneumovax  Saline Nasal Spray     Polio Booster         Tetanus Nausea:       Tuberculosis test or PPD  Vitamin B6 25 mg TID   AVOID:    Dramamine      *Gardasil  Emetrol       Live Poliovirus  Ginger Root 250 mg QID    MMR (measles, mumps &  High Complex Carbs @ Bedtime    rebella)  Sea Bands-Accupressure    Varicella (Chickenpox)  Unisom 1/2 tab TID     *No known complications           If received before Pain:         Known pregnancy;   Darvocet       Resume series after  Lortab        Delivery  Percocet    Yeast:   Tramadol      Femstat  Tylenol 3      Gyne-lotrimin  Ultram       Monistat  Vicodin           MISC:         All Sunscreens           Hair  Coloring/highlights          Insect Repellant's          (Including DEET)         Mystic Tans  

## 2021-01-16 NOTE — Progress Notes (Signed)
      Inetta Fermo Helle presents for NOB nurse intake visit. Pregnancy confirmation done at [facility] , 12/17/2020, with Doreene Burke.  G 1.  P 0.  LMP 11/04/2020.  EDD 08/11/2021.  Ga [redacted]w[redacted]d. Pregnancy education material explained and given.  3 cats in the home.  NOB labs ordered. BMI greater than 30. TSH/HbgA1c ordered. Sickle cell not ordered due to race. HIV and drug screen explained and ordered. Genetic screening discussed. Genetic testing; Unsure. Pt to discuss genetic testing with provider. PNV encouraged. Pt to follow up with provider in  2 weeks for NOB physical. FMLA, Drug/HIV screening form, [facility] Financial Policy all reviewed and signed by pt.

## 2021-01-17 LAB — MICROSCOPIC EXAMINATION: Epithelial Cells (non renal): >10 /hpf — AB (ref 0–10)

## 2021-01-17 LAB — URINALYSIS, ROUTINE W REFLEX MICROSCOPIC
Bilirubin, UA: NEGATIVE
Glucose, UA: NEGATIVE
Ketones, UA: NEGATIVE
Nitrite, UA: NEGATIVE
RBC, UA: NEGATIVE
Specific Gravity, UA: 1.021 (ref 1.005–1.030)
Urobilinogen, Ur: 1 mg/dL (ref 0.2–1.0)
pH, UA: 7.5 (ref 5.0–7.5)

## 2021-01-18 LAB — CULTURE, OB URINE

## 2021-01-18 LAB — URINE CULTURE, OB REFLEX

## 2021-01-18 LAB — GC/CHLAMYDIA PROBE AMP
Chlamydia trachomatis, NAA: NEGATIVE
Neisseria Gonorrhoeae by PCR: NEGATIVE

## 2021-01-20 LAB — HEMOGLOBIN A1C
Est. average glucose Bld gHb Est-mCnc: 103 mg/dL
Hgb A1c MFr Bld: 5.2 % (ref 4.8–5.6)

## 2021-01-20 LAB — TOXOPLASMA ANTIBODIES- IGG AND  IGM
Toxoplasma Antibody- IgM: <3 AU/mL (ref 0.0–7.9)
Toxoplasma IgG Ratio: <3 IU/mL (ref 0.0–7.1)

## 2021-01-20 LAB — VIRAL HEPATITIS HBV, HCV
HCV Ab: <0.1 s/co ratio (ref 0.0–0.9)
Hep B Core Total Ab: NEGATIVE
Hep B Surface Ab, Qual: REACTIVE
Hepatitis B Surface Ag: NEGATIVE

## 2021-01-20 LAB — HIV ANTIBODY (ROUTINE TESTING W REFLEX): HIV Screen 4th Generation wRfx: NONREACTIVE

## 2021-01-20 LAB — TSH: TSH: 0.487 u[IU]/mL (ref 0.450–4.500)

## 2021-01-20 LAB — ABO AND RH: Rh Factor: POSITIVE

## 2021-01-20 LAB — ANTIBODY SCREEN: Antibody Screen: NEGATIVE

## 2021-01-20 LAB — RUBELLA SCREEN: Rubella Antibodies, IGG: 1.86 index (ref >0.99)

## 2021-01-20 LAB — HCV INTERPRETATION

## 2021-01-20 LAB — VARICELLA ZOSTER ANTIBODY, IGG: Varicella zoster IgG: 261 index (ref >165)

## 2021-01-20 LAB — RPR: RPR Ser Ql: NONREACTIVE

## 2021-01-26 ENCOUNTER — Other Ambulatory Visit: Payer: Self-pay

## 2021-01-26 ENCOUNTER — Ambulatory Visit
Admission: RE | Admit: 2021-01-26 | Discharge: 2021-01-26 | Disposition: A | Discharge status: Home / Self Care | Payer: PRIVATE HEALTH INSURANCE | Source: Ambulatory Visit | Attending: Certified Nurse Midwife | Admitting: Certified Nurse Midwife

## 2021-01-26 ENCOUNTER — Encounter: Payer: 59 | Admitting: Certified Nurse Midwife

## 2021-01-26 DIAGNOSIS — N926 Irregular menstruation, unspecified: Secondary | ICD-10-CM | POA: Diagnosis not present

## 2021-02-02 ENCOUNTER — Ambulatory Visit (INDEPENDENT_AMBULATORY_CARE_PROVIDER_SITE_OTHER): Payer: PRIVATE HEALTH INSURANCE | Admitting: Certified Nurse Midwife

## 2021-02-02 ENCOUNTER — Encounter: Payer: Self-pay | Admitting: Certified Nurse Midwife

## 2021-02-02 ENCOUNTER — Other Ambulatory Visit: Payer: Self-pay | Admitting: Certified Nurse Midwife

## 2021-02-02 ENCOUNTER — Other Ambulatory Visit (HOSPITAL_COMMUNITY)
Admission: RE | Admit: 2021-02-02 | Discharge: 2021-02-02 | Disposition: A | Discharge status: Home / Self Care | Payer: PRIVATE HEALTH INSURANCE | Source: Ambulatory Visit | Attending: Certified Nurse Midwife | Admitting: Certified Nurse Midwife

## 2021-02-02 ENCOUNTER — Encounter: Payer: Self-pay | Admitting: Surgical

## 2021-02-02 ENCOUNTER — Other Ambulatory Visit: Payer: Self-pay

## 2021-02-02 VITALS — BP 118/86 | HR 80 | Wt 183.1 lb

## 2021-02-02 DIAGNOSIS — Z124 Encounter for screening for malignant neoplasm of cervix: Secondary | ICD-10-CM

## 2021-02-02 DIAGNOSIS — Z3A12 12 weeks gestation of pregnancy: Secondary | ICD-10-CM | POA: Diagnosis not present

## 2021-02-02 DIAGNOSIS — Z3491 Encounter for supervision of normal pregnancy, unspecified, first trimester: Secondary | ICD-10-CM

## 2021-02-02 LAB — POCT URINALYSIS DIPSTICK OB
Bilirubin, UA: NEGATIVE
Blood, UA: NEGATIVE
Glucose, UA: NEGATIVE
Ketones, UA: NEGATIVE
Leukocytes, UA: NEGATIVE
Nitrite, UA: NEGATIVE
Spec Grav, UA: 1.01 (ref 1.010–1.025)
Urobilinogen, UA: 0.2 E.U./dL
pH, UA: 6 (ref 5.0–8.0)

## 2021-02-02 NOTE — Patient Instructions (Signed)
https://www.[facility].org/womens-health/faqs/prenatal-genetic-screening-tests">  Prenatal Care Prenatal care is health care during pregnancy. It helps you and your unborn baby (fetus) stay as healthy as possible. Prenatal care may be provided by a midwife, a family practice doctor, a mid-level practitioner (nurse practitioner or physician assistant), or a childbirth and pregnancy doctor (obstetrician). How does this affect me? During pregnancy, you will be closely monitored for any new conditions that might develop. To lower your risk of pregnancy complications, you and your health care provider will talk about any underlying conditions you have. How does this affect my baby? Early and consistent prenatal care increases the chance that your baby will be healthy during pregnancy. Prenatal care lowers the risk that your baby will be:  Born early (prematurely).  Smaller than expected at birth (small for gestational age). What can I expect at the first prenatal care visit? Your first prenatal care visit will likely be the longest. You should schedule your first prenatal care visit as soon as you know that you are pregnant. Your first visit is a good time to talk about any questions or concerns you have about pregnancy. Medical history At your visit, you and your health care provider will talk about your medical history, including:  Any past pregnancies.  Your family's medical history.  Medical history of the baby's father.  Any long-term (chronic) health conditions you have and how you manage them.  Any surgeries or procedures you have had.  Any current over-the-counter or prescription medicines, herbs, or supplements that you are taking.  Other factors that could pose a risk to your baby, including: ? Exposure to harmful chemicals or radiation at work or at home. ? Any substance use, including tobacco, alcohol, and drug use.  Your home setting and your stress levels, including: ? Exposure to  abuse or violence. ? Household financial strain.  Your daily health habits, including diet and exercise. Tests and screenings Your health care provider will:  Measure your weight, height, and blood pressure.  Do a physical exam, including a pelvic and breast exam.  Perform blood tests and urine tests to check for: ? Urinary tract infection. ? Sexually transmitted infections (STIs). ? Low iron levels in your blood (anemia). ? Blood type and certain proteins on red blood cells (Rh antibodies). ? Infections and immunity to viruses, such as hepatitis B and rubella. ? HIV (human immunodeficiency virus).  Discuss your options for genetic screening. Tips about staying healthy Your health care provider will also give you information about how to keep yourself and your baby healthy, including:  Nutrition and taking vitamins.  Physical activity.  How to manage pregnancy symptoms such as nausea and vomiting (morning sickness).  Infections and substances that may be harmful to your baby and how to avoid them.  Food safety.  Dental care.  Working.  Travel.  Warning signs to watch for and when to call your health care provider. How often will I have prenatal care visits? After your first prenatal care visit, you will have regular visits throughout your pregnancy. The visit schedule is often as follows:  Up to week 28 of pregnancy: once every 4 weeks.  28-36 weeks: once every 2 weeks.  After 36 weeks: every week until delivery. Some women may have visits more or less often depending on any underlying health conditions and the health of the baby. Keep all follow-up and prenatal care visits. This is important. What happens during routine prenatal care visits? Your health care provider will:  Measure your weight   and blood pressure.  Check for fetal heart sounds.  Measure the height of your uterus in your abdomen (fundal height). This may be measured starting around week 20 of  pregnancy.  Check the position of your baby inside your uterus.  Ask questions about your diet, sleeping patterns, and whether you can feel the baby move.  Review warning signs to watch for and signs of labor.  Ask about any pregnancy symptoms you are having and how you are dealing with them. Symptoms may include: ? Headaches. ? Nausea and vomiting. ? Vaginal discharge. ? Swelling. ? Fatigue. ? Constipation. ? Changes in your vision. ? Feeling persistently sad or anxious. ? Any discomfort, including back or pelvic pain. ? Bleeding or spotting. Make a list of questions to ask your health care provider at your routine visits.   What tests might I have during prenatal care visits? You may have blood, urine, and imaging tests throughout your pregnancy, such as:  Urine tests to check for glucose, protein, or signs of infection.  Glucose tests to check for a form of diabetes that can develop during pregnancy (gestational diabetes mellitus). This is usually done around week 24 of pregnancy.  Ultrasounds to check your baby's growth and development, to check for birth defects, and to check your baby's well-being. These can also help to decide when you should deliver your baby.  A test to check for group B strep (GBS) infection. This is usually done around week 36 of pregnancy.  Genetic testing. This may include blood, fluid, or tissue sampling, or imaging tests, such as an ultrasound. Some genetic tests are done during the first trimester and some are done during the second trimester. What else can I expect during prenatal care visits? Your health care provider may recommend getting certain vaccines during pregnancy. These may include:  A yearly flu shot (annual influenza vaccine). This is especially important if you will be pregnant during flu season.  Tdap (tetanus, diphtheria, pertussis) vaccine. Getting this vaccine during pregnancy can protect your baby from whooping cough  (pertussis) after birth. This vaccine may be recommended between weeks 27 and 36 of pregnancy.  A COVID-19 vaccine. Later in your pregnancy, your health care provider may give you information about:  Childbirth and breastfeeding classes.  Choosing a health care provider for your baby.  Umbilical cord banking.  Breastfeeding.  Birth control after your baby is born.  The hospital labor and delivery unit and how to set up a tour.  Registering at the hospital before you go into labor. Where to find more information  Office on Women's Health: womenshealth.gov  American Pregnancy Association: americanpregnancy.org  March of Dimes: marchofdimes.org Summary  Prenatal care helps you and your baby stay as healthy as possible during pregnancy.  Your first prenatal care visit will most likely be the longest.  You will have visits and tests throughout your pregnancy to monitor your health and your baby's health.  Bring a list of questions to your visits to ask your health care provider.  Make sure to keep all follow-up and prenatal care visits. This information is not intended to replace advice given to you by your health care provider. Make sure you discuss any questions you have with your health care provider. Document Revised: 08/07/2020 Document Reviewed: 08/07/2020 Elsevier Patient Education  2021 [employer]  

## 2021-02-02 NOTE — Progress Notes (Signed)
NEW OB HISTORY AND PHYSICAL  SUBJECTIVE:       Jennifer Harper is a 30 y.o. G1P0 female, Patient's last menstrual period was 11/04/2020 (exact date)., Estimated Date of Delivery: 08/11/21, [redacted]w[redacted]d, presents today for establishment of Prenatal Care. She has no unusual complaints.   Social Relationship: engaged Living with partner Works: Engineer, site Exercise:none Vapes 2-3 hits daily, denies cigarettes smoking, drug use and alcohol use.    Gynecologic History Patient's last menstrual period was 11/04/2020 (exact date). Normal Contraception: none Last Pap: 11/29/2017. Results were: normal  Obstetric History OB History  Gravida Para Term Preterm AB Living  1            SAB IAB Ectopic Multiple Live Births               # Outcome Date GA Lbr Len/2nd Weight Sex Delivery Anes PTL Lv  1 Current             Past Medical History:  Diagnosis Date  . Anxiety     Past Surgical History:  Procedure Laterality Date  . BACK SURGERY      Current Outpatient Medications on File Prior to Visit  Medication Sig Dispense Refill  . clonazePAM (KLONOPIN) 0.5 MG tablet Take 1 tablet (0.5 mg total) by mouth 2 (two) times daily as needed for anxiety. (Patient not taking: Reported on 01/16/2021) 6 tablet 0  . Prenatal Vit-Fe Fumarate-FA (PRENATAL VITAMINS PO) Take by mouth.    . sertraline (ZOLOFT) 50 MG tablet 1/2 tab PO qhs x1 week 1 tab PO qhs x2 weeks 2 tabs PO qhs as tolerated 30 tablet 0   No current facility-administered medications on file prior to visit.    Allergies  Allergen Reactions  . Paroxetine Hcl Other (See Comments)    bruising bruising     Social History   Socioeconomic History  . Marital status: Single    Spouse name: Not on file  . Number of children: Not on file  . Years of education: Not on file  . Highest education level: Not on file  Occupational History  . Not on file  Tobacco Use  . Smoking status: Former Smoker    Quit date: 2011    Years  since quitting: 11.2  . Smokeless tobacco: Current User  . Tobacco comment: vapes  Vaping Use  . Vaping Use: Every day  . Substances: Nicotine  Substance and Sexual Activity  . Alcohol use: Not Currently    Alcohol/week: 3.0 standard drinks    Types: 3 Glasses of wine per week  . Drug use: No  . Sexual activity: Yes    Birth control/protection: None  Other Topics Concern  . Not on file  Social History Narrative  . Not on file   Social Determinants of Health   Financial Resource Strain: Not on file  Food Insecurity: Not on file  Transportation Needs: Not on file  Physical Activity: Not on file  Stress: Not on file  Social Connections: Not on file  Intimate Partner Violence: Not on file    Family History  Problem Relation Age of Onset  . Clotting disorder Mother   . Anemia Mother   . Heart disease Father   . Hypertension Father     The following portions of the patient's history were reviewed and updated as appropriate: allergies, current medications, past OB history, past medical history, past surgical history, past family history, past social history, and problem list.    OBJECTIVE: Initial  Physical Exam (New OB)  GENERAL APPEARANCE: alert, well appearing, in no apparent distress, oriented to person, place and time HEAD: normocephalic, atraumatic MOUTH: mucous membranes moist, pharynx normal without lesions THYROID: no thyromegaly or masses present BREASTS: no masses noted, no significant tenderness, no palpable axillary nodes, no skin changes LUNGS: clear to auscultation, no wheezes, rales or rhonchi, symmetric air entry HEART: regular rate and rhythm, no murmurs ABDOMEN: soft, nontender, nondistended, no abnormal masses, no epigastric pain and FHT present EXTREMITIES: no redness or tenderness in the calves or thighs SKIN: normal coloration and turgor, no rashes LYMPH NODES: no adenopathy palpable NEUROLOGIC: alert, oriented, normal speech, no focal findings or  movement disorder noted  PELVIC EXAM EXTERNAL GENITALIA: normal appearing vulva with no masses, tenderness or lesions VAGINA: no abnormal discharge or lesions CERVIX: no lesions or cervical motion tenderness, pap smear collected, contact bleeding present  UTERUS: gravid ADNEXA: no masses palpable and nontender OB EXAM PELVIMETRY: appears adequate RECTUM: exam not indicated  ASSESSMENT: Normal pregnancy  PLAN: New OB counseling: education given on vaping .  The patient has been given an overview regarding routine prenatal care. Recommendations regarding diet, weight gain, and exercise in pregnancy were given. Prenatal testing, optional genetic testing, carrier screening , and ultrasound use in pregnancy were reviewed.  Panorama testing today. Benefits of Breast Feeding were discussed. The patient is encouraged to consider nursing her baby post partum.  Doreene Burke, CNM

## 2021-02-03 LAB — CBC WITH DIFFERENTIAL/PLATELET
Basophils Absolute: 0 10*3/uL (ref 0.0–0.2)
Basos: 0 %
EOS (ABSOLUTE): 0 10*3/uL (ref 0.0–0.4)
Eos: 1 %
Hematocrit: 36.5 % (ref 34.0–46.6)
Hemoglobin: 12.4 g/dL (ref 11.1–15.9)
Immature Grans (Abs): 0 10*3/uL (ref 0.0–0.1)
Immature Granulocytes: 0 %
Lymphocytes Absolute: 1.3 10*3/uL (ref 0.7–3.1)
Lymphs: 23 %
MCH: 30.5 pg (ref 26.6–33.0)
MCHC: 34 g/dL (ref 31.5–35.7)
MCV: 90 fL (ref 79–97)
Monocytes Absolute: 0.7 10*3/uL (ref 0.1–0.9)
Monocytes: 12 %
Neutrophils Absolute: 3.6 10*3/uL (ref 1.4–7.0)
Neutrophils: 64 %
Platelets: 253 10*3/uL (ref 150–450)
RBC: 4.07 x10E6/uL (ref 3.77–5.28)
RDW: 11.3 % — ABNORMAL LOW (ref 11.7–15.4)
WBC: 5.6 10*3/uL (ref 3.4–10.8)

## 2021-02-03 LAB — DRUG PROFILE, UR, 9 DRUGS (LABCORP)
Amphetamines, Urine: NEGATIVE ng/mL
Barbiturate Quant, Ur: NEGATIVE ng/mL
Benzodiazepine Quant, Ur: NEGATIVE ng/mL
Cannabinoid Quant, Ur: NEGATIVE
Cocaine (Metab.): NEGATIVE ng/mL
Methadone Screen, Urine: NEGATIVE ng/mL
Opiate Quant, Ur: NEGATIVE ng/mL
PCP Quant, Ur: NEGATIVE ng/mL
Propoxyphene: NEGATIVE ng/mL

## 2021-02-03 LAB — NICOTINE SCREEN, URINE: Cotinine Ql Scrn, Ur: POSITIVE ng/mL — AB

## 2021-02-04 LAB — CYTOLOGY - PAP
Comment: NEGATIVE
Diagnosis: NEGATIVE
High risk HPV: NEGATIVE

## 2021-03-02 ENCOUNTER — Encounter: Payer: Self-pay | Admitting: Certified Nurse Midwife

## 2021-03-02 ENCOUNTER — Other Ambulatory Visit: Payer: Self-pay

## 2021-03-02 ENCOUNTER — Ambulatory Visit (INDEPENDENT_AMBULATORY_CARE_PROVIDER_SITE_OTHER): Payer: PRIVATE HEALTH INSURANCE | Admitting: Certified Nurse Midwife

## 2021-03-02 VITALS — BP 110/74 | HR 67 | Wt 184.6 lb

## 2021-03-02 DIAGNOSIS — Z3A16 16 weeks gestation of pregnancy: Secondary | ICD-10-CM

## 2021-03-02 DIAGNOSIS — Z3689 Encounter for other specified antenatal screening: Secondary | ICD-10-CM

## 2021-03-02 DIAGNOSIS — Z3402 Encounter for supervision of normal first pregnancy, second trimester: Secondary | ICD-10-CM

## 2021-03-02 DIAGNOSIS — Z720 Tobacco use: Secondary | ICD-10-CM | POA: Insufficient documentation

## 2021-03-02 LAB — POCT URINALYSIS DIPSTICK OB
Bilirubin, UA: NEGATIVE
Blood, UA: NEGATIVE
Glucose, UA: NEGATIVE
Ketones, UA: NEGATIVE
Leukocytes, UA: NEGATIVE
Nitrite, UA: NEGATIVE
Spec Grav, UA: 1.02 (ref 1.010–1.025)
Urobilinogen, UA: 0.2 E.U./dL
pH, UA: 6.5 (ref 5.0–8.0)

## 2021-03-02 NOTE — Progress Notes (Signed)
ROB. Doing well, except for allergies are making her cough really hard. She has been treating cough with Mucinex.

## 2021-03-02 NOTE — Progress Notes (Signed)
ROB-Doing well except for increased allergies. Discussed home treatment measures and safe pregnancy medication, see AVS. Anticipatory guidance regarding course of prenatal care. Reviewed red flag symptoms and when to call. RTC x 4 weeks for ANATOMY SCAN and ROB with ANNIE or sooner if needed.

## 2021-03-02 NOTE — Patient Instructions (Addendum)
Second Trimester of Pregnancy  The second trimester of pregnancy is from week 13 through week 27. This is also called months 4 through 6 of pregnancy. This is often the time when you feel your best. During the second trimester:  Morning sickness is less or has stopped.  You may have more energy.  You may feel hungry more often. At this time, your unborn baby (fetus) is growing very fast. At the end of the sixth month, the unborn baby may be up to 12 inches long and weigh about 1 pounds. You will likely start to feel the baby move between 16 and 20 weeks of pregnancy. Body changes during your second trimester Your body continues to go through many changes during this time. The changes vary and generally return to normal after the baby is born. Physical changes  You will gain more weight.  You may start to get stretch marks on your hips, belly (abdomen), and breasts.  Your breasts will grow and may hurt.  Dark spots or blotches may develop on your face.  A dark line from your belly button to the pubic area (linea nigra) may appear.  You may have changes in your hair. Health changes  You may have headaches.  You may have heartburn.  You may have trouble pooping (constipation).  You may have hemorrhoids or swollen, bulging veins (varicose veins).  Your gums may bleed.  You may pee (urinate) more often.  You may have back pain. Follow these instructions at home: Medicines  Take over-the-counter and prescription medicines only as told by your doctor. Some medicines are not safe during pregnancy.  Take a prenatal vitamin that contains at least 600 micrograms (mcg) of folic acid. Eating and drinking  Eat healthy meals that include: ? Fresh fruits and vegetables. ? Whole grains. ? Good sources of protein, such as meat, eggs, or tofu. ? Low-fat dairy products.  Avoid raw meat and unpasteurized juice, milk, and cheese.  You may need to take these actions to prevent or  treat trouble pooping: ? Drink enough fluids to keep your pee (urine) pale yellow. ? Eat foods that are high in fiber. These include beans, whole grains, and fresh fruits and vegetables. ? Limit foods that are high in fat and sugar. These include fried or sweet foods. Activity  Exercise only as told by your doctor. Most people can do their usual exercise during pregnancy. Try to exercise for 30 minutes at least 5 days a week.  Stop exercising if you have pain or cramps in your belly or lower back.  Do not exercise if it is too hot or too humid, or if you are in a place of great height (high altitude).  Avoid heavy lifting.  If you choose to, you may have sex unless your doctor tells you not to. Relieving pain and discomfort  Wear a good support bra if your breasts are sore.  Take warm water baths (sitz baths) to soothe pain or discomfort caused by hemorrhoids. Use hemorrhoid cream if your doctor approves.  Rest with your legs raised (elevated) if you have leg cramps or low back pain.  If you develop bulging veins in your legs: ? Wear support hose as told by your doctor. ? Raise your feet for 15 minutes, 3-4 times a day. ? Limit salt in your food. Safety  Wear your seat belt at all times when you are in a car.  Talk with your doctor if someone is hurting you or yelling  at you a lot. Lifestyle  Do not use hot tubs, steam rooms, or saunas.  Do not douche. Do not use tampons or scented sanitary pads.  Avoid cat litter boxes and soil used by cats. These carry germs that can harm your baby and can cause a loss of your baby by miscarriage or stillbirth.  Do not use herbal medicines, illegal drugs, or medicines that are not approved by your doctor. Do not drink alcohol.  Do not smoke or use any products that contain nicotine or tobacco. If you need help quitting, ask your doctor. General instructions  Keep all follow-up visits. This is important.  Ask your doctor about local  prenatal classes.  Ask your doctor about the right foods to eat or for help finding a counselor. Where to find more information  American Pregnancy Association: americanpregnancy.org  American College of Obstetricians and Gynecologists: www.acog.org  Office on Women's Health: womenshealth.gov/pregnancy Contact a doctor if:  You have a headache that does not go away when you take medicine.  You have changes in how you see, or you see spots in front of your eyes.  You have mild cramps, pressure, or pain in your lower belly.  You continue to feel like you may vomit (nauseous), you vomit, or you have watery poop (diarrhea).  You have bad-smelling fluid coming from your vagina.  You have pain when you pee or your pee smells bad.  You have very bad swelling of your face, hands, ankles, feet, or legs.  You have a fever. Get help right away if:  You are leaking fluid from your vagina.  You have spotting or bleeding from your vagina.  You have very bad belly cramping or pain.  You have trouble breathing.  You have chest pain.  You faint.  You have not felt your baby move for the time period told by your doctor.  You have new or increased pain, swelling, or redness in an arm or leg. Summary  The second trimester of pregnancy is from week 13 through week 27 (months 4 through 6).  Eat healthy meals.  Exercise as told by your doctor. Most people can do their usual exercise during pregnancy.  Do not use herbal medicines, illegal drugs, or medicines that are not approved by your doctor. Do not drink alcohol.  Call your doctor if you get sick or if you notice anything unusual about your pregnancy. This information is not intended to replace advice given to you by your health care provider. Make sure you discuss any questions you have with your health care provider. Document Revised: 04/02/2020 Document Reviewed: 02/07/2020 Elsevier Patient Education  2021 Elsevier  Inc. Common Medications Safe in Pregnancy  Acne:      Constipation:  Benzoyl Peroxide     Colace  Clindamycin      Dulcolax Suppository  Topica Erythromycin     Fibercon  Salicylic Acid      Metamucil         Miralax AVOID:        Senakot   Accutane    Cough:  Retin-A       Cough Drops  Tetracycline      Phenergan w/ Codeine if Rx  Minocycline      Robitussin (Plain & DM)  Antibiotics:     Crabs/Lice:  Ceclor       RID  Cephalosporins    AVOID:  E-Mycins      Kwell  Keflex  Macrobid/Macrodantin   Diarrhea:    Penicillin      Kao-Pectate  Zithromax      Imodium AD         PUSH FLUIDS AVOID:       Cipro     Fever:  Tetracycline      Tylenol (Regular or Extra  Minocycline       Strength)  Levaquin      Extra Strength-Do not          Exceed 8 tabs/24 hrs Caffeine:        <200mg/day (equiv. To 1 cup of coffee or  approx. 3 12 oz sodas)         Gas: Cold/Hayfever:       Gas-X  Benadryl      Mylicon  Claritin       Phazyme  **Claritin-D        Chlor-Trimeton    Headaches:  Dimetapp      ASA-Free Excedrin  Drixoral-Non-Drowsy     Cold Compress  Mucinex (Guaifenasin)     Tylenol (Regular or Extra  Sudafed/Sudafed-12 Hour     Strength)  **Sudafed PE Pseudoephedrine   Tylenol Cold & Sinus     Vicks Vapor Rub  Zyrtec  **AVOID if Problems With Blood Pressure         Heartburn: Avoid lying down for at least 1 hour after meals  Aciphex      Maalox     Rash:  Milk of Magnesia     Benadryl    Mylanta       1% Hydrocortisone Cream  Pepcid  Pepcid Complete   Sleep Aids:  Prevacid      Ambien   Prilosec       Benadryl  Rolaids       Chamomile Tea  Tums (Limit 4/day)     Unisom         Tylenol PM         Warm milk-add vanilla or  Hemorrhoids:       Sugar for taste  Anusol/Anusol H.C.  (RX: Analapram 2.5%)  Sugar Substitutes:  Hydrocortisone OTC     Ok in moderation  Preparation H      Tucks        Vaseline lotion applied to tissue with  wiping    Herpes:     Throat:  Acyclovir      Oragel  Famvir  Valtrex     Vaccines:         Flu Shot Leg Cramps:       *Gardasil  Benadryl      Hepatitis A         Hepatitis B Nasal Spray:       Pneumovax  Saline Nasal Spray     Polio Booster         Tetanus Nausea:       Tuberculosis test or PPD  Vitamin B6 25 mg TID   AVOID:    Dramamine      *Gardasil  Emetrol       Live Poliovirus  Ginger Root 250 mg QID    MMR (measles, mumps &  High Complex Carbs @ Bedtime    rebella)  Sea Bands-Accupressure    Varicella (Chickenpox)  Unisom 1/2 tab TID     *No known complications           If received before Pain:         Known pregnancy;   Darvocet       Resume   Resume series after  Lortab        Delivery  Percocet    Yeast:   Tramadol      Femstat  Tylenol 3      Gyne-lotrimin  Ultram       Monistat  Vicodin           MISC:         All Sunscreens           Hair Coloring/highlights          Insect Repellant's          (Including DEET)         Mystic Tans

## 2021-03-30 ENCOUNTER — Encounter: Payer: PRIVATE HEALTH INSURANCE | Admitting: Certified Nurse Midwife

## 2021-04-14 ENCOUNTER — Other Ambulatory Visit: Payer: Self-pay

## 2021-04-14 ENCOUNTER — Encounter: Payer: Self-pay | Admitting: Certified Nurse Midwife

## 2021-04-14 ENCOUNTER — Ambulatory Visit (INDEPENDENT_AMBULATORY_CARE_PROVIDER_SITE_OTHER): Payer: PRIVATE HEALTH INSURANCE | Admitting: Certified Nurse Midwife

## 2021-04-14 VITALS — BP 125/86 | HR 77 | Wt 196.6 lb

## 2021-04-14 DIAGNOSIS — Z3402 Encounter for supervision of normal first pregnancy, second trimester: Secondary | ICD-10-CM

## 2021-04-14 DIAGNOSIS — Z3A23 23 weeks gestation of pregnancy: Secondary | ICD-10-CM

## 2021-04-14 LAB — POCT URINALYSIS DIPSTICK OB
Bilirubin, UA: NEGATIVE
Blood, UA: NEGATIVE
Glucose, UA: NEGATIVE
Ketones, UA: NEGATIVE
Leukocytes, UA: NEGATIVE
Nitrite, UA: NEGATIVE
POC,PROTEIN,UA: NEGATIVE
Spec Grav, UA: <=1.005 — AB (ref 1.010–1.025)
Urobilinogen, UA: 0.2 E.U./dL
pH, UA: 6 (ref 5.0–8.0)

## 2021-04-14 NOTE — Patient Instructions (Addendum)
WHAT OB PATIENTS CAN EXPECT   Confirmation of pregnancy and ultrasound ordered if medically indicated-[redacted] weeks gestation  New OB (NOB) intake with nurse and New OB (NOB) labs- [redacted] weeks gestation  New OB (NOB) physical examination with provider- 11/[redacted] weeks gestation  Flu vaccine-[redacted] weeks gestation  Anatomy scan-[redacted] weeks gestation  Glucose tolerance test, blood work to test for anemia, T-dap vaccine-[redacted] weeks gestation  Vaginal swabs/cultures-STD/Group B strep-[redacted] weeks gestation  Appointments every 4 weeks until 28 weeks  Every 2 weeks from 28 weeks until 36 weeks  Weekly visits from 36 weeks until delivery  Second Trimester of Pregnancy  The second trimester of pregnancy is from week 13 through week 27. This is also called months 4 through 6 of pregnancy. This is often the time when you feel your best. During the second trimester:  Morning sickness is less or has stopped.  You may have more energy.  You may feel hungry more often. At this time, your unborn baby (fetus) is growing very fast. At the end of the sixth month, the unborn baby may be up to 12 inches long and weigh about 1 pounds. You will likely start to feel the baby move between 16 and 20 weeks of pregnancy. Body changes during your second trimester Your body continues to go through many changes during this time. The changes vary and generally return to normal after the baby is born. Physical changes  You will gain more weight.  You may start to get stretch marks on your hips, belly (abdomen), and breasts.  Your breasts will grow and may hurt.  Dark spots or blotches may develop on your face.  A dark line from your belly button to the pubic area (linea nigra) may appear.  You may have changes in your hair. Health changes  You may have headaches.  You may have heartburn.  You may have trouble pooping (constipation).  You may have hemorrhoids or swollen, bulging veins (varicose veins).  Your gums may  bleed.  You may pee (urinate) more often.  You may have back pain. Follow these instructions at home: Medicines  Take over-the-counter and prescription medicines only as told by your doctor. Some medicines are not safe during pregnancy.  Take a prenatal vitamin that contains at least 600 micrograms (mcg) of folic acid. Eating and drinking  Eat healthy meals that include: ? Fresh fruits and vegetables. ? Whole grains. ? Good sources of protein, such as meat, eggs, or tofu. ? Low-fat dairy products.  Avoid raw meat and unpasteurized juice, milk, and cheese.  You may need to take these actions to prevent or treat trouble pooping: ? Drink enough fluids to keep your pee (urine) pale yellow. ? Eat foods that are high in fiber. These include beans, whole grains, and fresh fruits and vegetables. ? Limit foods that are high in fat and sugar. These include fried or sweet foods. Activity  Exercise only as told by your doctor. Most people can do their usual exercise during pregnancy. Try to exercise for 30 minutes at least 5 days a week.  Stop exercising if you have pain or cramps in your belly or lower back.  Do not exercise if it is too hot or too humid, or if you are in a place of great height (high altitude).  Avoid heavy lifting.  If you choose to, you may have sex unless your doctor tells you not to. Relieving pain and discomfort  Wear a good support bra if your breasts   are sore.  Take warm water baths (sitz baths) to soothe pain or discomfort caused by hemorrhoids. Use hemorrhoid cream if your doctor approves.  Rest with your legs raised (elevated) if you have leg cramps or low back pain.  If you develop bulging veins in your legs: ? Wear support hose as told by your doctor. ? Raise your feet for 15 minutes, 3-4 times a day. ? Limit salt in your food. Safety  Wear your seat belt at all times when you are in a car.  Talk with your doctor if someone is hurting you or  yelling at you a lot. Lifestyle  Do not use hot tubs, steam rooms, or saunas.  Do not douche. Do not use tampons or scented sanitary pads.  Avoid cat litter boxes and soil used by cats. These carry germs that can harm your baby and can cause a loss of your baby by miscarriage or stillbirth.  Do not use herbal medicines, illegal drugs, or medicines that are not approved by your doctor. Do not drink alcohol.  Do not smoke or use any products that contain nicotine or tobacco. If you need help quitting, ask your doctor. General instructions  Keep all follow-up visits. This is important.  Ask your doctor about local prenatal classes.  Ask your doctor about the right foods to eat or for help finding a counselor. Where to find more information  American Pregnancy Association: americanpregnancy.org  American College of Obstetricians and Gynecologists: www.acog.org  Office on Women's Health: womenshealth.gov/pregnancy Contact a doctor if:  You have a headache that does not go away when you take medicine.  You have changes in how you see, or you see spots in front of your eyes.  You have mild cramps, pressure, or pain in your lower belly.  You continue to feel like you may vomit (nauseous), you vomit, or you have watery poop (diarrhea).  You have bad-smelling fluid coming from your vagina.  You have pain when you pee or your pee smells bad.  You have very bad swelling of your face, hands, ankles, feet, or legs.  You have a fever. Get help right away if:  You are leaking fluid from your vagina.  You have spotting or bleeding from your vagina.  You have very bad belly cramping or pain.  You have trouble breathing.  You have chest pain.  You faint.  You have not felt your baby move for the time period told by your doctor.  You have new or increased pain, swelling, or redness in an arm or leg. Summary  The second trimester of pregnancy is from week 13 through week 27  (months 4 through 6).  Eat healthy meals.  Exercise as told by your doctor. Most people can do their usual exercise during pregnancy.  Do not use herbal medicines, illegal drugs, or medicines that are not approved by your doctor. Do not drink alcohol.  Call your doctor if you get sick or if you notice anything unusual about your pregnancy. This information is not intended to replace advice given to you by your health care provider. Make sure you discuss any questions you have with your health care provider. Document Revised: 04/02/2020 Document Reviewed: 02/07/2020 Elsevier Patient Education  2021 [employer] Inc. Common Medications Safe in Pregnancy  Acne:      Constipation:  Benzoyl Peroxide     Colace  Clindamycin      Dulcolax Suppository  Topica Erythromycin     Fibercon  Salicylic Acid        Metamucil         Miralax AVOID:        Senakot   Accutane    Cough:  Retin-A       Cough Drops  Tetracycline      Phenergan w/ Codeine if Rx  Minocycline      Robitussin (Plain & DM)  Antibiotics:     Crabs/Lice:  Ceclor       RID  Cephalosporins    AVOID:  E-Mycins      Kwell  Keflex  Macrobid/Macrodantin   Diarrhea:  Penicillin      Kao-Pectate  Zithromax      Imodium AD         PUSH FLUIDS AVOID:       Cipro     Fever:  Tetracycline      Tylenol (Regular or Extra  Minocycline       Strength)  Levaquin      Extra Strength-Do not          Exceed 8 tabs/24 hrs Caffeine:        <200mg/day (equiv. To 1 cup of coffee or  approx. 3 12 oz sodas)         Gas: Cold/Hayfever:       Gas-X  Benadryl      Mylicon  Claritin       Phazyme  **Claritin-D        Chlor-Trimeton    Headaches:  Dimetapp      ASA-Free Excedrin  Drixoral-Non-Drowsy     Cold Compress  Mucinex (Guaifenasin)     Tylenol (Regular or Extra  Sudafed/Sudafed-12 Hour     Strength)  **Sudafed PE Pseudoephedrine   Tylenol Cold & Sinus     Vicks Vapor Rub  Zyrtec  **AVOID if Problems With Blood  Pressure         Heartburn: Avoid lying down for at least 1 hour after meals  Aciphex      Maalox     Rash:  Milk of Magnesia     Benadryl    Mylanta       1% Hydrocortisone Cream  Pepcid  Pepcid Complete   Sleep Aids:  Prevacid      Ambien   Prilosec       Benadryl  Rolaids       Chamomile Tea  Tums (Limit 4/day)     Unisom         Tylenol PM         Warm milk-add vanilla or  Hemorrhoids:       Sugar for taste  Anusol/Anusol H.C.  (RX: Analapram 2.5%)  Sugar Substitutes:  Hydrocortisone OTC     Ok in moderation  Preparation H      Tucks        Vaseline lotion applied to tissue with wiping    Herpes:     Throat:  Acyclovir      Oragel  Famvir  Valtrex     Vaccines:         Flu Shot Leg Cramps:       *Gardasil  Benadryl      Hepatitis A         Hepatitis B Nasal Spray:       Pneumovax  Saline Nasal Spray     Polio Booster         Tetanus Nausea:       Tuberculosis test or PPD  Vitamin B6 25 mg TID   AVOID:      Dramamine      *Gardasil  Emetrol       Live Poliovirus  Ginger Root 250 mg QID    MMR (measles, mumps &  High Complex Carbs @ Bedtime    rebella)  Sea Bands-Accupressure    Varicella (Chickenpox)  Unisom 1/2 tab TID     *No known complications           If received before Pain:         Known pregnancy;   Darvocet       Resume series after  Lortab        Delivery  Percocet    Yeast:   Tramadol      Femstat  Tylenol 3      Gyne-lotrimin  Ultram       Monistat  Vicodin           MISC:         All Sunscreens           Hair Coloring/highlights          Insect Repellant's          (Including DEET)         Mystic Tans Oral Glucose Tolerance Test During Pregnancy Why am I having this test? The oral glucose tolerance test (OGTT) is done to check how your body processes blood sugar (glucose). This is one of several tests used to diagnose diabetes that develops during pregnancy (gestational diabetes mellitus). Gestational diabetes is a short-term form of  diabetes that some women develop while they are pregnant. It usually occurs during the second trimester of pregnancy and goes away after delivery. Testing, or screening, for gestational diabetes usually occurs at weeks 24-28 of pregnancy. You may have the OGTT test after having a 1-hour glucose screening test if the results from that test indicate that you may have gestational diabetes. This test may also be needed if:  You have a history of gestational diabetes.  There is a history of giving birth to very large babies or of losing pregnancies (having stillbirths).  You have signs and symptoms of diabetes, such as: ? Changes in your eyesight. ? Tingling or numbness in your hands or feet. ? Changes in hunger, thirst, and urination, and these are not explained by your pregnancy. What is being tested? This test measures the amount of glucose in your blood at different times during a period of 3 hours. This shows how well your body can process glucose. What kind of sample is taken? Blood samples are required for this test. They are usually collected by inserting a needle into a blood vessel.   How do I prepare for this test?  For 3 days before your test, eat normally. Have plenty of carbohydrate-rich foods.  Follow instructions from your health care provider about: ? Eating or drinking restrictions on the day of the test. You may be asked not to eat or drink anything other than water (to fast) starting 8-10 hours before the test. ? Changing or stopping your regular medicines. Some medicines may interfere with this test. Tell a health care provider about:  All medicines you are taking, including vitamins, herbs, eye drops, creams, and over-the-counter medicines.  Any blood disorders you have.  Any surgeries you have had.  Any medical conditions you have. What happens during the test? First, your blood glucose will be measured. This is referred to as your fasting blood glucose because you  fasted before the test. Then, you will drink  a glucose solution that contains a certain amount of glucose. Your blood glucose will be measured again 1, 2, and 3 hours after you drink the solution. This test takes about 3 hours to complete. You will need to stay at the testing location during this time. During the testing period:  Do not eat or drink anything other than the glucose solution.  Do not exercise.  Do not use any products that contain nicotine or tobacco, such as cigarettes, e-cigarettes, and chewing tobacco. These can affect your test results. If you need help quitting, ask your health care provider. The testing procedure may vary among health care providers and hospitals. How are the results reported? Your results will be reported as milligrams of glucose per deciliter of blood (mg/dL) or millimoles per liter (mmol/L). There is more than one source for screening and diagnosis reference values used to diagnose gestational diabetes. Your health care provider will compare your results to normal values that were established after testing a large group of people (reference values). Reference values may vary among labs and hospitals. For this test (Carpenter-Coustan), reference values are:  Fasting: 95 mg/dL (5.3 mmol/L).  1 hour: 180 mg/dL (10.0 mmol/L).  2 hour: 155 mg/dL (8.6 mmol/L).  3 hour: 140 mg/dL (7.8 mmol/L). What do the results mean? Results below the reference values are considered normal. If two or more of your blood glucose levels are at or above the reference values, you may be diagnosed with gestational diabetes. If only one level is high, your health care provider may suggest repeat testing or other tests to confirm a diagnosis. Talk with your health care provider about what your results mean. Questions to ask your health care provider Ask your health care provider, or the department that is doing the test:  When will my results be ready?  How will I get my  results?  What are my treatment options?  What other tests do I need?  What are my next steps? Summary  The oral glucose tolerance test (OGTT) is one of several tests used to diagnose diabetes that develops during pregnancy (gestational diabetes mellitus). Gestational diabetes is a short-term form of diabetes that some women develop while they are pregnant.  You may have the OGTT test after having a 1-hour glucose screening test if the results from that test show that you may have gestational diabetes. You may also have this test if you have any symptoms or risk factors for this type of diabetes.  Talk with your health care provider about what your results mean. This information is not intended to replace advice given to you by your health care provider. Make sure you discuss any questions you have with your health care provider. Document Revised: 04/03/2020 Document Reviewed: 04/03/2020 Elsevier Patient Education  2021 [employer].

## 2021-04-14 NOTE — Progress Notes (Signed)
ROB-Pt present for routine prenatal care. pt c/o thin white discharge

## 2021-04-14 NOTE — Progress Notes (Signed)
ROB doing well. Feeling good movement. Discussed anatomy u/s scheduled for 6/30. Reviewed glucose screening next visit. She verbalizes and agrees. Reviewed round ligament pain and self help measurse. Follow up rob 5 wk with Loletta Specter, CNM

## 2021-05-07 ENCOUNTER — Other Ambulatory Visit: Payer: Self-pay

## 2021-05-07 ENCOUNTER — Ambulatory Visit
Admission: RE | Admit: 2021-05-07 | Discharge: 2021-05-07 | Disposition: A | Discharge status: Home / Self Care | Payer: PRIVATE HEALTH INSURANCE | Source: Ambulatory Visit | Attending: Certified Nurse Midwife | Admitting: Certified Nurse Midwife

## 2021-05-07 DIAGNOSIS — Z3402 Encounter for supervision of normal first pregnancy, second trimester: Secondary | ICD-10-CM | POA: Insufficient documentation

## 2021-05-07 DIAGNOSIS — Z3689 Encounter for other specified antenatal screening: Secondary | ICD-10-CM | POA: Diagnosis present

## 2021-05-08 ENCOUNTER — Other Ambulatory Visit: Payer: Self-pay | Admitting: Certified Nurse Midwife

## 2021-05-08 DIAGNOSIS — Z3402 Encounter for supervision of normal first pregnancy, second trimester: Secondary | ICD-10-CM

## 2021-05-08 DIAGNOSIS — Z3689 Encounter for other specified antenatal screening: Secondary | ICD-10-CM

## 2021-05-08 NOTE — Progress Notes (Signed)
Follow up ultrasound ordered due to INCOMPLETE ANATOMY SCAN-heart views, see chart.    Jennifer Harper, CNM [facility], [facility] 05/08/21 4:47 PM

## 2021-05-21 ENCOUNTER — Other Ambulatory Visit: Payer: PRIVATE HEALTH INSURANCE

## 2021-05-21 ENCOUNTER — Encounter: Payer: PRIVATE HEALTH INSURANCE | Admitting: Certified Nurse Midwife

## 2021-06-01 ENCOUNTER — Ambulatory Visit
Admission: RE | Admit: 2021-06-01 | Discharge: 2021-06-01 | Disposition: A | Discharge status: Home / Self Care | Payer: PRIVATE HEALTH INSURANCE | Source: Ambulatory Visit | Attending: Certified Nurse Midwife | Admitting: Certified Nurse Midwife

## 2021-06-01 ENCOUNTER — Other Ambulatory Visit: Payer: Self-pay

## 2021-06-01 DIAGNOSIS — Z3402 Encounter for supervision of normal first pregnancy, second trimester: Secondary | ICD-10-CM | POA: Diagnosis not present

## 2021-06-01 DIAGNOSIS — Z3689 Encounter for other specified antenatal screening: Secondary | ICD-10-CM

## 2021-06-08 ENCOUNTER — Encounter: Payer: PRIVATE HEALTH INSURANCE | Admitting: Certified Nurse Midwife

## 2021-06-08 ENCOUNTER — Other Ambulatory Visit: Payer: PRIVATE HEALTH INSURANCE

## 2021-06-09 ENCOUNTER — Other Ambulatory Visit: Payer: PRIVATE HEALTH INSURANCE

## 2021-06-09 ENCOUNTER — Other Ambulatory Visit: Payer: Self-pay

## 2021-06-09 ENCOUNTER — Ambulatory Visit (INDEPENDENT_AMBULATORY_CARE_PROVIDER_SITE_OTHER): Payer: PRIVATE HEALTH INSURANCE | Admitting: Certified Nurse Midwife

## 2021-06-09 VITALS — BP 118/75 | HR 84 | Wt 208.1 lb

## 2021-06-09 DIAGNOSIS — Z3403 Encounter for supervision of normal first pregnancy, third trimester: Secondary | ICD-10-CM

## 2021-06-09 DIAGNOSIS — Z3A31 31 weeks gestation of pregnancy: Secondary | ICD-10-CM

## 2021-06-09 LAB — POCT URINALYSIS DIPSTICK OB
Bilirubin, UA: NEGATIVE
Blood, UA: NEGATIVE
Glucose, UA: NEGATIVE
Ketones, UA: NEGATIVE
Leukocytes, UA: NEGATIVE
Nitrite, UA: NEGATIVE
POC,PROTEIN,UA: NEGATIVE
Spec Grav, UA: 1.01 (ref 1.010–1.025)
Urobilinogen, UA: 0.2 E.U./dL
pH, UA: 7.5 (ref 5.0–8.0)

## 2021-06-09 NOTE — Progress Notes (Signed)
ROB doing well. Feels good movement. 28 wk labs today: Glucose screen/RPR/CBC. Tdap declined,  all questions answered. Ready set baby reviewed, see check list for topics covered. Sample birth plan given, will follow up in upcoming visits. Discussed birth control after delivery, information pamphlet given. Discussed plan of care given that only one midwife in office currently. Discussed possible visits with MD if needed.   Follow up 2 wk  for ROB or sooner if needed.    Doreene Burke, CNM

## 2021-06-10 LAB — GLUCOSE, 1 HOUR GESTATIONAL: Gestational Diabetes Screen: 130 mg/dL (ref 65–139)

## 2021-06-22 NOTE — Patient Instructions (Signed)
Breastfeeding and Breast Care It is normal to have some problems when you start to breastfeed your new baby. But there are things that you can do to take care of yourself and help prevent problems. This includes keeping your breasts healthy and making sure that your baby's mouth attaches (latches) properly to your nipple for feedings. Work with your doctor or breastfeeding specialist to find what works best foryou. How does self-care benefit me? If you keep your breasts healthy and you let your baby attach to your nipples in the right way, you will avoid these problems: Cracked or sore nipples. Breasts becoming overfilled with milk. Plugged milk ducts. Low milk supply. Breast swelling or infection. How does self-care benefit my baby? By preventing problems with your breasts, you will ensure that your baby willfeed well and will gain the right amount of weight. What actions can I take to care for myself during breastfeeding? Best ways to breastfeed Always make sure that your baby latches properly to breastfeed. Make sure that your baby is in a proper position. Try different breastfeeding positions to find one that works best for you and your baby. Breastfeed when you feel like you need to make your breasts less full or when your baby shows signs of hunger. This is called "breastfeeding on demand." Do not delay feedings. Try to relax when it is time to feed your baby. This helps your body release milk from your breast. To help increase milk flow, do these things before feeding: Remove a small amount of milk from your breast. Use a pump or squeeze with your hand. Apply warm, moist heat to your breast. Do this in the shower or use hand towels soaked with warm water. Massage your breasts. Do this when you are breastfeeding as well. Caring for your breasts     To help your breasts stay healthy and keep them from getting too dry: Avoid using soap on your nipples. Let your nipples air-dry for  3-4 minutes after each feeding. Do not use things like a hair dryer to dry your breasts. This can make the skin dry and will cause irritation and pain. Use only cotton bra pads to soak up breast milk that leaks. Change the pads if they become soaked with milk. If you use bra pads that can be thrown away, change them often. Put some lanolin on your nipples after breastfeeding. Pure lanolin does not need to be washed off your nipple before you feed your baby again. Pure lanolin is not harmful to your baby. Rub some breast milk into your nipples: Use your hand to squeeze out a few drops of breast milk. Gently massage the milk into your nipples. Let your nipples air-dry. Wear a supportive nursing bra. Avoid wearing: Tight clothing. Underwire bras or bras that put pressure on your breasts. Use ice to help relieve pain or swelling of your breasts: Put ice in a plastic bag. Place a towel between your skin and the bag. Leave the ice on for 20 minutes, 2-3 times a day. Follow these instructions at home: Drink enough fluid to keep your pee (urine) pale yellow. Get plenty of rest. Sleep when your baby sleeps. Talk to your doctor or breastfeeding specialist before taking any herbal supplements. Eat a balanced diet. This includes fruits, vegetables, whole grains, lean proteins, and dairy or dairy alternatives Contact a health care provider if: You have nipple pain. You have cracking or soreness in your nipples that lasts longer than 1 week. Your breasts are  overfilled with milk, and this lasts longer than 48 hours. You have a fever. You have pus-like fluid coming from your nipple. You have redness, a rash, swelling, itching, or burning on your breast. Your baby does not gain weight. Your baby loses weight. Your baby is not feeding regularly or is very sleepy and lacks energy. Summary There are things that you can do to take care of yourself and help prevent many common breastfeeding problems. Always  make sure that your baby's mouth attaches (latches) to your nipple properly to breastfeed. Keep your nipples from getting too dry, drink plenty of fluid, and get plenty of rest. Feed on demand. Do not delay feedings. This information is not intended to replace advice given to you by your health care provider. Make sure you discuss any questions you have with your health care provider. Document Revised: 04/15/2020 Document Reviewed: 04/15/2020 Elsevier Patient Education  2022 Elsevier Inc.    Common Medications Safe in Pregnancy  Acne:      Constipation:  Benzoyl Peroxide     Colace  Clindamycin      Dulcolax Suppository  Topica Erythromycin     Fibercon  Salicylic Acid      Metamucil         Miralax AVOID:        Senakot   Accutane    Cough:  Retin-A       Cough Drops  Tetracycline      Phenergan w/ Codeine if Rx  Minocycline      Robitussin (Plain & DM)  Antibiotics:     Crabs/Lice:  Ceclor       RID  Cephalosporins    AVOID:  E-Mycins      Kwell  Keflex  Macrobid/Macrodantin   Diarrhea:  Penicillin      Kao-Pectate  Zithromax      Imodium AD         PUSH FLUIDS AVOID:       Cipro     Fever:  Tetracycline      Tylenol (Regular or Extra  Minocycline       Strength)  Levaquin      Extra Strength-Do not          Exceed 8 tabs/24 hrs Caffeine:        <200mg/day (equiv. To 1 cup of coffee or  approx. 3 12 oz sodas)         Gas: Cold/Hayfever:       Gas-X  Benadryl      Mylicon  Claritin       Phazyme  **Claritin-D        Chlor-Trimeton    Headaches:  Dimetapp      ASA-Free Excedrin  Drixoral-Non-Drowsy     Cold Compress  Mucinex (Guaifenasin)     Tylenol (Regular or Extra  Sudafed/Sudafed-12 Hour     Strength)  **Sudafed PE Pseudoephedrine   Tylenol Cold & Sinus     Vicks Vapor Rub  Zyrtec  **AVOID if Problems With Blood Pressure         Heartburn: Avoid lying down for at least 1 hour after meals  Aciphex      Maalox     Rash:  Milk of  Magnesia     Benadryl    Mylanta       1% Hydrocortisone Cream  Pepcid  Pepcid Complete   Sleep Aids:  Prevacid      Ambien   Prilosec       Benadryl    Rolaids       Chamomile Tea  Tums (Limit 4/day)     Unisom         Tylenol PM         Warm milk-add vanilla or  Hemorrhoids:       Sugar for taste  Anusol/Anusol H.C.  (RX: Analapram 2.5%)  Sugar Substitutes:  Hydrocortisone OTC     Ok in moderation  Preparation H      Tucks        Vaseline lotion applied to tissue with wiping    Herpes:     Throat:  Acyclovir      Oragel  Famvir  Valtrex     Vaccines:         Flu Shot Leg Cramps:       *Gardasil  Benadryl      Hepatitis A         Hepatitis B Nasal Spray:       Pneumovax  Saline Nasal Spray     Polio Booster         Tetanus Nausea:       Tuberculosis test or PPD  Vitamin B6 25 mg TID   AVOID:    Dramamine      *Gardasil  Emetrol       Live Poliovirus  Ginger Root 250 mg QID    MMR (measles, mumps &  High Complex Carbs @ Bedtime    rebella)  Sea Bands-Accupressure    Varicella (Chickenpox)  Unisom 1/2 tab TID     *No known complications           If received before Pain:         Known pregnancy;   Darvocet       Resume series after  Lortab        Delivery  Percocet    Yeast:   Tramadol      Femstat  Tylenol 3      Gyne-lotrimin  Ultram       Monistat  Vicodin           MISC:         All Sunscreens           Hair Coloring/highlights          Insect Repellant's          (Including DEET)         Mystic Tans  

## 2021-06-23 ENCOUNTER — Other Ambulatory Visit: Payer: Self-pay

## 2021-06-23 ENCOUNTER — Encounter: Payer: Self-pay | Admitting: Obstetrics and Gynecology

## 2021-06-23 ENCOUNTER — Ambulatory Visit (INDEPENDENT_AMBULATORY_CARE_PROVIDER_SITE_OTHER): Payer: PRIVATE HEALTH INSURANCE | Admitting: Obstetrics and Gynecology

## 2021-06-23 VITALS — BP 113/78 | HR 80 | Wt 210.6 lb

## 2021-06-23 DIAGNOSIS — Z23 Encounter for immunization: Secondary | ICD-10-CM | POA: Diagnosis not present

## 2021-06-23 DIAGNOSIS — Z3A33 33 weeks gestation of pregnancy: Secondary | ICD-10-CM

## 2021-06-23 DIAGNOSIS — Z3403 Encounter for supervision of normal first pregnancy, third trimester: Secondary | ICD-10-CM

## 2021-06-23 LAB — POCT URINALYSIS DIPSTICK OB
Bilirubin, UA: NEGATIVE
Blood, UA: NEGATIVE
Glucose, UA: NEGATIVE
Leukocytes, UA: NEGATIVE
POC,PROTEIN,UA: NEGATIVE
Spec Grav, UA: 1.01 (ref 1.010–1.025)
Urobilinogen, UA: 0.2 E.U./dL
pH, UA: 7.5 (ref 5.0–8.0)

## 2021-06-23 NOTE — Progress Notes (Signed)
ROB: She feels pretty good, has no new concerns today.

## 2021-06-23 NOTE — Progress Notes (Signed)
ROB: No complaints.  Reports daily fetal movement.  Received Tdap today.  CBC and RPR drawn.  Follow-up with Dr. Valentino Saxon in 2 weeks.

## 2021-06-24 LAB — CBC
Hematocrit: 32.1 % — ABNORMAL LOW (ref 34.0–46.6)
Hemoglobin: 10.7 g/dL — ABNORMAL LOW (ref 11.1–15.9)
MCH: 28.9 pg (ref 26.6–33.0)
MCHC: 33.3 g/dL (ref 31.5–35.7)
MCV: 87 fL (ref 79–97)
Platelets: 250 10*3/uL (ref 150–450)
RBC: 3.7 x10E6/uL — ABNORMAL LOW (ref 3.77–5.28)
RDW: 12.4 % (ref 11.7–15.4)
WBC: 7.4 10*3/uL (ref 3.4–10.8)

## 2021-06-24 LAB — RPR: RPR Ser Ql: NONREACTIVE

## 2021-07-10 ENCOUNTER — Other Ambulatory Visit: Payer: Self-pay

## 2021-07-10 ENCOUNTER — Ambulatory Visit (INDEPENDENT_AMBULATORY_CARE_PROVIDER_SITE_OTHER): Payer: PRIVATE HEALTH INSURANCE | Admitting: Obstetrics and Gynecology

## 2021-07-10 ENCOUNTER — Encounter: Payer: Self-pay | Admitting: Obstetrics and Gynecology

## 2021-07-10 VITALS — BP 116/72 | HR 77 | Wt 215.3 lb

## 2021-07-10 DIAGNOSIS — Z3403 Encounter for supervision of normal first pregnancy, third trimester: Secondary | ICD-10-CM

## 2021-07-10 DIAGNOSIS — Z3A35 35 weeks gestation of pregnancy: Secondary | ICD-10-CM

## 2021-07-10 DIAGNOSIS — R638 Other symptoms and signs concerning food and fluid intake: Secondary | ICD-10-CM | POA: Insufficient documentation

## 2021-07-10 LAB — POCT URINALYSIS DIPSTICK OB
Bilirubin, UA: NEGATIVE
Blood, UA: NEGATIVE
Glucose, UA: NEGATIVE
Ketones, UA: NEGATIVE
Nitrite, UA: NEGATIVE
POC,PROTEIN,UA: NEGATIVE
Spec Grav, UA: 1.02 (ref 1.010–1.025)
Urobilinogen, UA: 0.2 E.U./dL
pH, UA: 6 (ref 5.0–8.0)

## 2021-07-10 NOTE — Patient Instructions (Addendum)

## 2021-07-10 NOTE — Progress Notes (Signed)
ROB: Patient doing well, no major issues.  Does note some swelling in her feet and legs, discussed normal pregnancy symptoms vs warning signs of more serious conditions.  Plans to breastfeed, has birth plan (ok with all meds). RTC in 1 week, for 36 week labs at that time.

## 2021-07-10 NOTE — Progress Notes (Signed)
  OB-Pt present for routine prenatal care. Pt stated fetal movement present; no contractions present; no vaginal bleeding and no changes in vaginal discharge.     Pt c/o swollen feet and legs along with pelvic pain and pressure.

## 2021-07-15 ENCOUNTER — Ambulatory Visit (INDEPENDENT_AMBULATORY_CARE_PROVIDER_SITE_OTHER): Payer: PRIVATE HEALTH INSURANCE | Admitting: Obstetrics and Gynecology

## 2021-07-15 ENCOUNTER — Other Ambulatory Visit: Payer: Self-pay

## 2021-07-15 VITALS — BP 145/85 | HR 85 | Wt 217.8 lb

## 2021-07-15 DIAGNOSIS — Z3403 Encounter for supervision of normal first pregnancy, third trimester: Secondary | ICD-10-CM

## 2021-07-15 DIAGNOSIS — Z3A36 36 weeks gestation of pregnancy: Secondary | ICD-10-CM

## 2021-07-15 DIAGNOSIS — Z113 Encounter for screening for infections with a predominantly sexual mode of transmission: Secondary | ICD-10-CM

## 2021-07-15 LAB — POCT URINALYSIS DIPSTICK OB
Blood, UA: NEGATIVE
Glucose, UA: NEGATIVE
Leukocytes, UA: NEGATIVE
Nitrite, UA: NEGATIVE
Spec Grav, UA: 1.01 (ref 1.010–1.025)
Urobilinogen, UA: 0.2 E.U./dL
pH, UA: 7.5 (ref 5.0–8.0)

## 2021-07-15 LAB — OB RESULTS CONSOLE GC/CHLAMYDIA: Gonorrhea: NEGATIVE

## 2021-07-15 NOTE — Progress Notes (Signed)
ROB: Generally feeling well.  Reports daily fetal movement.  Repeat blood pressure 130/78.  Cultures performed-GBS- GC/CT

## 2021-07-17 LAB — STREP GP B NAA: Strep Gp B NAA: NEGATIVE

## 2021-07-18 LAB — GC/CHLAMYDIA PROBE AMP
Chlamydia trachomatis, NAA: NEGATIVE
Neisseria Gonorrhoeae by PCR: NEGATIVE

## 2021-07-22 ENCOUNTER — Other Ambulatory Visit: Payer: Self-pay

## 2021-07-22 ENCOUNTER — Encounter: Payer: Self-pay | Admitting: Certified Nurse Midwife

## 2021-07-22 ENCOUNTER — Ambulatory Visit (INDEPENDENT_AMBULATORY_CARE_PROVIDER_SITE_OTHER): Payer: PRIVATE HEALTH INSURANCE | Admitting: Certified Nurse Midwife

## 2021-07-22 VITALS — BP 121/86 | HR 77 | Wt 218.2 lb

## 2021-07-22 DIAGNOSIS — Z3403 Encounter for supervision of normal first pregnancy, third trimester: Secondary | ICD-10-CM

## 2021-07-22 DIAGNOSIS — Z3A37 37 weeks gestation of pregnancy: Secondary | ICD-10-CM

## 2021-07-22 LAB — POCT URINALYSIS DIPSTICK OB
Bilirubin, UA: NEGATIVE
Blood, UA: NEGATIVE
Glucose, UA: NEGATIVE
Ketones, UA: NEGATIVE
Leukocytes, UA: NEGATIVE
Nitrite, UA: NEGATIVE
POC,PROTEIN,UA: NEGATIVE
Spec Grav, UA: 1.01 (ref 1.010–1.025)
Urobilinogen, UA: 0.2 E.U./dL
pH, UA: 6 (ref 5.0–8.0)

## 2021-07-22 NOTE — Progress Notes (Signed)
ROB: She is doing well. No concerns today. °

## 2021-07-22 NOTE — Progress Notes (Signed)
ROB doing well, feeling good fetal movement. Labor precautions reviewed. SVE per pt request 1/thich/high. Herbal prep handout given. Follow up 1 wk .   Doreene Burke, CNM

## 2021-07-22 NOTE — Patient Instructions (Signed)
Braxton Hicks Contractions Contractions of the uterus can occur throughout pregnancy, but they are not always a sign that you are in labor. You may have practice contractions called Braxton Hicks contractions. These false labor contractions are sometimes confused with true labor. What are Braxton Hicks contractions? Braxton Hicks contractions are tightening movements that occur in the muscles of the uterus before labor. Unlike true labor contractions, these contractions do not result in opening (dilation) and thinning of the lowest part of the uterus (cervix). Toward the end of pregnancy (32-34 weeks), Braxton Hicks contractions can happen more often and may become stronger. These contractions are sometimes difficult to tell apart from true labor because they can be very uncomfortable. How to tell the difference between true labor and false labor True labor Contractions last 30-70 seconds. Contractions become very regular. Discomfort is usually felt in the top of the uterus, and it spreads to the lower abdomen and low back. Contractions do not go away with walking. Contractions usually become stronger and more frequent. The cervix dilates and gets thinner. False labor Contractions are usually shorter, weaker, and farther apart than true labor contractions. Contractions are usually irregular. Contractions are often felt in the front of the lower abdomen and in the groin. Contractions may go away when you walk around or change positions while lying down. The cervix usually does not dilate or become thin. Sometimes, the only way to tell if you are in true labor is for your health care provider to look for changes in your cervix. Your health care provider will do a physical exam and may monitor your contractions. If you are in true labor, your health care provider will send you home with instructions about when to return to the hospital. You may continue to have Braxton Hicks contractions until you  go into true labor. Follow these instructions at home:  Take over-the-counter and prescription medicines only as told by your health care provider. If Braxton Hicks contractions are making you uncomfortable: Change your position from lying down or resting to walking, or change from walking to resting. Sit and rest in a tub of warm water. Drink enough fluid to keep your urine pale yellow. Dehydration may cause these contractions. Do slow and deep breathing several times an hour. Keep all follow-up visits. This is important. Contact a health care provider if: You have a fever. You have continuous pain in your abdomen. Your contractions become stronger, more regular, and closer together. You pass blood-tinged mucus. Get help right away if: You have fluid leaking or gushing from your vagina. You have bright red blood coming from your vagina. Your baby is not moving inside you as much as it used to. Summary You may have practice contractions called Braxton Hicks contractions. These false labor contractions are sometimes confused with true labor. Braxton Hicks contractions are usually shorter, weaker, farther apart, and less regular than true labor contractions. True labor contractions usually become stronger, more regular, and more frequent. Manage discomfort from Braxton Hicks contractions by changing position, resting in a warm bath, practicing deep breathing, and drinking plenty of water. Keep all follow-up visits. Contact your health care provider if your contractions become stronger, more regular, and closer together. This information is not intended to replace advice given to you by your health care provider. Make sure you discuss any questions you have with your health care provider. Document Revised: 09/01/2020 Document Reviewed: 09/01/2020 Elsevier Patient Education  2022 Elsevier Inc[employer]  

## 2021-07-31 ENCOUNTER — Ambulatory Visit (INDEPENDENT_AMBULATORY_CARE_PROVIDER_SITE_OTHER): Payer: PRIVATE HEALTH INSURANCE | Admitting: Certified Nurse Midwife

## 2021-07-31 ENCOUNTER — Other Ambulatory Visit: Payer: Self-pay

## 2021-07-31 VITALS — BP 142/82 | HR 82 | Wt 224.2 lb

## 2021-07-31 DIAGNOSIS — Z3A38 38 weeks gestation of pregnancy: Secondary | ICD-10-CM

## 2021-07-31 DIAGNOSIS — Z3403 Encounter for supervision of normal first pregnancy, third trimester: Secondary | ICD-10-CM

## 2021-07-31 LAB — POCT URINALYSIS DIPSTICK OB
Bilirubin, UA: NEGATIVE
Blood, UA: NEGATIVE
Glucose, UA: NEGATIVE
Ketones, UA: NEGATIVE
Nitrite, UA: NEGATIVE
Spec Grav, UA: 1.01 (ref 1.010–1.025)
Urobilinogen, UA: 0.2 E.U./dL
pH, UA: 6.5 (ref 5.0–8.0)

## 2021-07-31 NOTE — Progress Notes (Signed)
ROB doing well, feeling good movement. Labor precautions reviewed. SVE per pt request 1/thick/high. BP slight elevated , pt state she was rushing to get her and was stressed. RP 128/86. Pre eclampsia S&S reviewed. Follow up 1 wk.   Doreene Burke, CNM

## 2021-07-31 NOTE — Patient Instructions (Addendum)
Preeclampsia and Eclampsia Preeclampsia is a serious condition that may develop during pregnancy. This condition involves high blood pressure during pregnancy and causes symptoms such as headaches, vision changes, and increased swelling in the legs, hands, and face. Preeclampsia occurs after 20 weeks of pregnancy. Eclampsia is a seizure that happens from worsening preeclampsia. Diagnosing and managing preeclampsia early is important. If not treated early, it can cause serious problems for mother and baby. There is no cure for this condition. However, during pregnancy, delivering the baby may be the best treatment for preeclampsia or eclampsia. For most women, symptoms of preeclampsia and eclampsia go away after giving birth. In rare cases, a woman may develop preeclampsia or eclampsia after giving birth. This usually occurs within 48 hours after childbirth but may occur up to 6 weeks after giving birth. What are the causes? The cause of this condition is not known. What increases the risk? The following factors make you more likely to develop preeclampsia: Being pregnant for the first time or being pregnant with multiples. Having had preeclampsia or a condition called hemolysis, elevated liver enzymes, and low platelet count (HELLP)syndrome during a past pregnancy. Having a family history of preeclampsia. Being older than age 35. Being obese. Becoming pregnant through fertility treatments. Conditions that reduce blood flow or oxygen to your placenta and baby may also increase your risk. These include: High blood pressure before, during, or immediately following pregnancy. Kidney disease. Diabetes. Blood clotting disorders. Autoimmune diseases, such as lupus. Sleep apnea. What are the signs or symptoms? Common symptoms of this condition include: A severe, throbbing headache that does not go away. Vision problems, such as blurred or double vision and light sensitivity. Pain in the stomach,  especially the right upper region. Pain in the shoulder. Other symptoms that may develop as the condition gets worse include: Sudden weight gain because of fluid buildup in the body. This causes swelling of the face, hands, legs, and feet. Severe nausea and vomiting. Urinating less than usual. Shortness of breath. Seizures. How is this diagnosed? Your health care provider will ask you about symptoms and check for signs of preeclampsia during your prenatal visits. You will also have routine tests, including: Checking your blood pressure. Urine tests to check for protein. Blood tests to assess your organ function. Monitoring your baby's heart rate. Ultrasounds to check fetal growth. How is this treated? You and your health care provider will determine the treatment that is best for you. Treatment may include: Frequent prenatal visits to check for preeclampsia. Medicine to lower your blood pressure. Medicine to prevent seizures. Low-dose aspirin during your pregnancy. Staying in the hospital, in severe cases. You will be given medicines to control your blood pressure and the amount of fluids in your body. Delivering your baby. Work with your health care provider to manage any chronic health conditions, such as diabetes or kidney problems. Also, work with your health care provider to manage weight gain during pregnancy. Follow these instructions at home: Eating and drinking Drink enough fluid to keep your urine pale yellow. Avoid caffeine. Caffeine may increase blood pressure and heart rate and lead to dehydration. Reduce the amount of salt that you eat. Lifestyle Do not use any products that contain nicotine or tobacco. These products include cigarettes, chewing tobacco, and vaping devices, such as e-cigarettes. If you need help quitting, ask your health care provider. Do not use alcohol or drugs. Avoid stress as much as possible. Rest and get plenty of sleep. General  instructions  Take   over-the-counter and prescription medicines only as told by your health care provider. When lying down, lie on your left side. This keeps pressure off your major blood vessels. When sitting or lying down, raise (elevate) your feet. Try putting pillows underneath your lower legs. Exercise regularly. Ask your health care provider what kinds of exercise are best for you. Check your blood pressure as often as recommended by your health care provider. Keep all prenatal and follow-up visits. This is important. Contact a health care provider if: You have symptoms that may need treatment or closer monitoring. These include: Headaches. Stomach pain or nausea and vomiting. Shoulder pain. Vision problems, such as spots in front of your eyes or blurry vision. Sudden weight gain or increased swelling in your face, hands, legs, and feet. Increased anxiety or feeling of impending doom. Signs or symptoms of labor. Get help right away if: You have any of the following symptoms: A seizure. Shortness of breath or trouble breathing. Trouble speaking or slurred speech. Fainting. Chest pain. These symptoms may represent a serious problem that is an emergency. Do not wait to see if the symptoms will go away. Get medical help right away. Call your local emergency services (911 in the [place] Do not drive yourself to the hospital. Summary Preeclampsia is a serious condition that may develop during pregnancy. Diagnosing and treating preeclampsia early is very important. Keep all prenatal and follow-up visits. This is important. Get help right away if you have a seizure, shortness of breath or trouble breathing, trouble speaking or slurred speech, chest pain, or fainting. This information is not intended to replace advice given to you by your health care provider. Make sure you discuss any questions you have with your health care provider. Document Revised: 07/17/2020 Document Reviewed:  07/17/2020 Elsevier Patient Education  2022 [employer] Inc[employer] of the uterus can occur throughout pregnancy, but they are not always a sign that you are in labor. You may have practice contractions called Braxton Hicks contractions. These false labor contractions are sometimes confused with true labor. What are Deberah Pelton contractions? Braxton Hicks contractions are tightening movements that occur in the muscles of the uterus before labor. Unlike true labor contractions, these contractions do not result in opening (dilation) and thinning of the lowest part of the uterus (cervix). Toward the end of pregnancy (32-34 weeks), Braxton Hicks contractions can happen more often and may become stronger. These contractions are sometimes difficult to tell apart from true labor because they can be very uncomfortable. How to tell the difference between true labor and false labor True labor Contractions last 30-70 seconds. Contractions become very regular. Discomfort is usually felt in the top of the uterus, and it spreads to the lower abdomen and low back. Contractions do not go away with walking. Contractions usually become stronger and more frequent. The cervix dilates and gets thinner. False labor Contractions are usually shorter, weaker, and farther apart than true labor contractions. Contractions are usually irregular. Contractions are often felt in the front of the lower abdomen and in the groin. Contractions may go away when you walk around or change positions while lying down. The cervix usually does not dilate or become thin. Sometimes, the only way to tell if you are in true labor is for your health care provider to look for changes in your cervix. Your health care provider will do a physical exam and may monitor your contractions. If you are in true labor, your health care provider will send  you home with instructions about when to return to the  hospital. You may continue to have Braxton Hicks contractions until you go into true labor. Follow these instructions at home:  Take over-the-counter and prescription medicines only as told by your health care provider. If Braxton Hicks contractions are making you uncomfortable: Change your position from lying down or resting to walking, or change from walking to resting. Sit and rest in a tub of warm water. Drink enough fluid to keep your urine pale yellow. Dehydration may cause these contractions. Do slow and deep breathing several times an hour. Keep all follow-up visits. This is important. Contact a health care provider if: You have a fever. You have continuous pain in your abdomen. Your contractions become stronger, more regular, and closer together. You pass blood-tinged mucus. Get help right away if: You have fluid leaking or gushing from your vagina. You have bright red blood coming from your vagina. Your baby is not moving inside you as much as it used to. Summary You may have practice contractions called Braxton Hicks contractions. These false labor contractions are sometimes confused with true labor. Braxton Hicks contractions are usually shorter, weaker, farther apart, and less regular than true labor contractions. True labor contractions usually become stronger, more regular, and more frequent. Manage discomfort from [facility] contractions by changing position, resting in a warm bath, practicing deep breathing, and drinking plenty of water. Keep all follow-up visits. Contact your health care provider if your contractions become stronger, more regular, and closer together. This information is not intended to replace advice given to you by your health care provider. Make sure you discuss any questions you have with your health care provider. Document Revised: 09/01/2020 Document Reviewed: 09/01/2020 Elsevier Patient Education  2022 [employer].

## 2021-08-07 ENCOUNTER — Ambulatory Visit (INDEPENDENT_AMBULATORY_CARE_PROVIDER_SITE_OTHER): Payer: PRIVATE HEALTH INSURANCE | Admitting: Certified Nurse Midwife

## 2021-08-07 ENCOUNTER — Other Ambulatory Visit: Payer: Self-pay

## 2021-08-07 VITALS — BP 130/84 | HR 81 | Wt 227.4 lb

## 2021-08-07 DIAGNOSIS — Z3A39 39 weeks gestation of pregnancy: Secondary | ICD-10-CM

## 2021-08-07 DIAGNOSIS — Z3403 Encounter for supervision of normal first pregnancy, third trimester: Secondary | ICD-10-CM

## 2021-08-07 LAB — POCT URINALYSIS DIPSTICK OB
Bilirubin, UA: NEGATIVE
Blood, UA: NEGATIVE
Glucose, UA: NEGATIVE
Ketones, UA: NEGATIVE
Leukocytes, UA: NEGATIVE
Nitrite, UA: NEGATIVE
POC,PROTEIN,UA: NEGATIVE
Spec Grav, UA: 1.015 (ref 1.010–1.025)
Urobilinogen, UA: 0.2 E.U./dL
pH, UA: 7.5 (ref 5.0–8.0)

## 2021-08-07 NOTE — Patient Instructions (Signed)
Braxton Hicks Contractions Contractions of the uterus can occur throughout pregnancy, but they are not always a sign that you are in labor. You may have practice contractions called Braxton Hicks contractions. These false labor contractions are sometimes confused with true labor. What are Braxton Hicks contractions? Braxton Hicks contractions are tightening movements that occur in the muscles of the uterus before labor. Unlike true labor contractions, these contractions do not result in opening (dilation) and thinning of the lowest part of the uterus (cervix). Toward the end of pregnancy (32-34 weeks), Braxton Hicks contractions can happen more often and may become stronger. These contractions are sometimes difficult to tell apart from true labor because they can be very uncomfortable. How to tell the difference between true labor and false labor True labor Contractions last 30-70 seconds. Contractions become very regular. Discomfort is usually felt in the top of the uterus, and it spreads to the lower abdomen and low back. Contractions do not go away with walking. Contractions usually become stronger and more frequent. The cervix dilates and gets thinner. False labor Contractions are usually shorter, weaker, and farther apart than true labor contractions. Contractions are usually irregular. Contractions are often felt in the front of the lower abdomen and in the groin. Contractions may go away when you walk around or change positions while lying down. The cervix usually does not dilate or become thin. Sometimes, the only way to tell if you are in true labor is for your health care provider to look for changes in your cervix. Your health care provider will do a physical exam and may monitor your contractions. If you are in true labor, your health care provider will send you home with instructions about when to return to the hospital. You may continue to have Braxton Hicks contractions until you  go into true labor. Follow these instructions at home:  Take over-the-counter and prescription medicines only as told by your health care provider. If Braxton Hicks contractions are making you uncomfortable: Change your position from lying down or resting to walking, or change from walking to resting. Sit and rest in a tub of warm water. Drink enough fluid to keep your urine pale yellow. Dehydration may cause these contractions. Do slow and deep breathing several times an hour. Keep all follow-up visits. This is important. Contact a health care provider if: You have a fever. You have continuous pain in your abdomen. Your contractions become stronger, more regular, and closer together. You pass blood-tinged mucus. Get help right away if: You have fluid leaking or gushing from your vagina. You have bright red blood coming from your vagina. Your baby is not moving inside you as much as it used to. Summary You may have practice contractions called Braxton Hicks contractions. These false labor contractions are sometimes confused with true labor. Braxton Hicks contractions are usually shorter, weaker, farther apart, and less regular than true labor contractions. True labor contractions usually become stronger, more regular, and more frequent. Manage discomfort from Braxton Hicks contractions by changing position, resting in a warm bath, practicing deep breathing, and drinking plenty of water. Keep all follow-up visits. Contact your health care provider if your contractions become stronger, more regular, and closer together. This information is not intended to replace advice given to you by your health care provider. Make sure you discuss any questions you have with your health care provider. Document Revised: 09/01/2020 Document Reviewed: 09/01/2020 Elsevier Patient Education  2022 Elsevier Inc[employer]  

## 2021-08-07 NOTE — Progress Notes (Signed)
ROB doing well. Feeling good fetal movement. Discussed remainder of PNC. Discussed induction @ 41 wks should labor not occur prior. She is in agreement. NST next visit . Pre e S&S  Reviewed. PT deneis having had any. Follow up 1 wk.   Doreene Burke, CNM

## 2021-08-10 ENCOUNTER — Telehealth: Payer: Self-pay | Admitting: Certified Nurse Midwife

## 2021-08-10 NOTE — Telephone Encounter (Signed)
Patient called and wanted the office to be aware that she lost her mucus plug on Saturday.  She stated that she has nothing else going on but the office to be aware.

## 2021-08-11 ENCOUNTER — Inpatient Hospital Stay: Admit: 2021-08-11 | Payer: Self-pay

## 2021-08-12 ENCOUNTER — Other Ambulatory Visit: Payer: Self-pay

## 2021-08-12 ENCOUNTER — Ambulatory Visit (INDEPENDENT_AMBULATORY_CARE_PROVIDER_SITE_OTHER): Payer: PRIVATE HEALTH INSURANCE | Admitting: Certified Nurse Midwife

## 2021-08-12 ENCOUNTER — Observation Stay (HOSPITAL_BASED_OUTPATIENT_CLINIC_OR_DEPARTMENT_OTHER)
Admission: EM | Admit: 2021-08-12 | Discharge: 2021-08-12 | Disposition: A | Discharge status: Home / Self Care | Payer: PRIVATE HEALTH INSURANCE | Source: Home / Self Care | Admitting: Certified Nurse Midwife

## 2021-08-12 VITALS — BP 137/88 | HR 73 | Wt 229.2 lb

## 2021-08-12 DIAGNOSIS — O480 Post-term pregnancy: Secondary | ICD-10-CM | POA: Diagnosis not present

## 2021-08-12 DIAGNOSIS — Z3A40 40 weeks gestation of pregnancy: Secondary | ICD-10-CM

## 2021-08-12 DIAGNOSIS — O471 False labor at or after 37 completed weeks of gestation: Secondary | ICD-10-CM | POA: Insufficient documentation

## 2021-08-12 DIAGNOSIS — Z3400 Encounter for supervision of normal first pregnancy, unspecified trimester: Secondary | ICD-10-CM

## 2021-08-12 NOTE — Progress Notes (Signed)
Pt present for evaluation of contractions. State she woke up this morning 0400 with ctx, she was having them q 8 mins but they have slowed down. She is feeling good movement , denies LOF. She has had some bloody discharge. SVE 3/70/-3 . Reviewed labor precaution , pt encouraged to go home rest, eat , hydrate and return to hospital for SROM or ctx that are more regular 2-5 min apart consistently for 1 hr that are strong. She has an appointment on Friday. She is to keep that if she does not go into labor. She verbalize and agrees to plan   Doreene Burke, CNM

## 2021-08-12 NOTE — Patient Instructions (Signed)
Braxton Hicks Contractions Contractions of the uterus can occur throughout pregnancy, but they are not always a sign that you are in labor. You may have practice contractions called Braxton Hicks contractions. These false labor contractions are sometimes confused with true labor. What are Braxton Hicks contractions? Braxton Hicks contractions are tightening movements that occur in the muscles of the uterus before labor. Unlike true labor contractions, these contractions do not result in opening (dilation) and thinning of the lowest part of the uterus (cervix). Toward the end of pregnancy (32-34 weeks), Braxton Hicks contractions can happen more often and may become stronger. These contractions are sometimes difficult to tell apart from true labor because they can be very uncomfortable. How to tell the difference between true labor and false labor True labor Contractions last 30-70 seconds. Contractions become very regular. Discomfort is usually felt in the top of the uterus, and it spreads to the lower abdomen and low back. Contractions do not go away with walking. Contractions usually become stronger and more frequent. The cervix dilates and gets thinner. False labor Contractions are usually shorter, weaker, and farther apart than true labor contractions. Contractions are usually irregular. Contractions are often felt in the front of the lower abdomen and in the groin. Contractions may go away when you walk around or change positions while lying down. The cervix usually does not dilate or become thin. Sometimes, the only way to tell if you are in true labor is for your health care provider to look for changes in your cervix. Your health care provider will do a physical exam and may monitor your contractions. If you are in true labor, your health care provider will send you home with instructions about when to return to the hospital. You may continue to have Braxton Hicks contractions until you  go into true labor. Follow these instructions at home:  Take over-the-counter and prescription medicines only as told by your health care provider. If Braxton Hicks contractions are making you uncomfortable: Change your position from lying down or resting to walking, or change from walking to resting. Sit and rest in a tub of warm water. Drink enough fluid to keep your urine pale yellow. Dehydration may cause these contractions. Do slow and deep breathing several times an hour. Keep all follow-up visits. This is important. Contact a health care provider if: You have a fever. You have continuous pain in your abdomen. Your contractions become stronger, more regular, and closer together. You pass blood-tinged mucus. Get help right away if: You have fluid leaking or gushing from your vagina. You have bright red blood coming from your vagina. Your baby is not moving inside you as much as it used to. Summary You may have practice contractions called Braxton Hicks contractions. These false labor contractions are sometimes confused with true labor. Braxton Hicks contractions are usually shorter, weaker, farther apart, and less regular than true labor contractions. True labor contractions usually become stronger, more regular, and more frequent. Manage discomfort from Braxton Hicks contractions by changing position, resting in a warm bath, practicing deep breathing, and drinking plenty of water. Keep all follow-up visits. Contact your health care provider if your contractions become stronger, more regular, and closer together. This information is not intended to replace advice given to you by your health care provider. Make sure you discuss any questions you have with your health care provider. Document Revised: 09/01/2020 Document Reviewed: 09/01/2020 Elsevier Patient Education  2022 Elsevier Inc[employer]  

## 2021-08-12 NOTE — OB Triage Note (Signed)
    L&D OB Triage Note  SUBJECTIVE Jennifer Harper is a 30 y.o. G1P0 female at [redacted]w[redacted]d, EDD Estimated Date of Delivery: 08/11/21 who presented to triage with complaints of irregular contraction since this morning. Was seen in the office earlier today. She continues to have irregular contractions state she wanted to be checked for change. She denies lof and vaginal bleeding. Feeling good fetal movement.    OB History  Gravida Para Term Preterm AB Living  1 0 0 0 0 0  SAB IAB Ectopic Multiple Live Births  0 0 0 0 0    # Outcome Date GA Lbr Len/2nd Weight Sex Delivery Anes PTL Lv  1 Current             Medications Prior to Admission  Medication Sig Dispense Refill Last Dose   Prenatal Vit-Fe Fumarate-FA (PRENATAL VITAMINS PO) Take by mouth.        OBJECTIVE  Nursing Evaluation:   Temp 98.7 F (37.1 C) (Oral)   LMP 11/04/2020 (Exact Date)    Findings:        Irregular ctx      NST was performed and has been reviewed by me.  NST INTERPRETATION: Category I  Mode: External Baseline Rate (A): 140 bpm Variability: Moderate Accelerations: 15 x 15 Decelerations: None     Contraction Frequency (min): 7  ASSESSMENT Impression:  1.  Pregnancy:  G1P0 at [redacted]w[redacted]d , EDD Estimated Date of Delivery: 08/11/21 2.  Reassuring fetal and maternal status 3.  No cervical change from office exam   PLAN 1. Current condition and above findings reviewed.  Reassuring fetal and maternal condition. Pt given option to ambulate and recheck . States since she has not changes she prefers to go home.  2. Discharge home with standard labor precautions given to return to L&D or call the office for problems. 3. Continue routine prenatal care as scheduled in office on Friday.   Doreene Burke, CNM

## 2021-08-13 ENCOUNTER — Inpatient Hospital Stay: Payer: PRIVATE HEALTH INSURANCE | Admitting: Anesthesiology

## 2021-08-13 ENCOUNTER — Encounter: Payer: Self-pay | Admitting: Obstetrics and Gynecology

## 2021-08-13 ENCOUNTER — Inpatient Hospital Stay
Admission: EM | Admit: 2021-08-13 | Discharge: 2021-08-15 | DRG: 805 | Disposition: A | Discharge status: Home / Self Care | Payer: PRIVATE HEALTH INSURANCE | Attending: Obstetrics and Gynecology | Admitting: Obstetrics and Gynecology

## 2021-08-13 ENCOUNTER — Other Ambulatory Visit: Payer: Self-pay

## 2021-08-13 DIAGNOSIS — O26893 Other specified pregnancy related conditions, third trimester: Secondary | ICD-10-CM | POA: Diagnosis present

## 2021-08-13 DIAGNOSIS — O99214 Obesity complicating childbirth: Secondary | ICD-10-CM | POA: Diagnosis not present

## 2021-08-13 DIAGNOSIS — D62 Acute posthemorrhagic anemia: Secondary | ICD-10-CM | POA: Diagnosis not present

## 2021-08-13 DIAGNOSIS — Z20822 Contact with and (suspected) exposure to covid-19: Secondary | ICD-10-CM | POA: Diagnosis present

## 2021-08-13 DIAGNOSIS — O9903 Anemia complicating the puerperium: Secondary | ICD-10-CM | POA: Diagnosis not present

## 2021-08-13 DIAGNOSIS — Z87891 Personal history of nicotine dependence: Secondary | ICD-10-CM | POA: Diagnosis not present

## 2021-08-13 DIAGNOSIS — R638 Other symptoms and signs concerning food and fluid intake: Secondary | ICD-10-CM | POA: Diagnosis present

## 2021-08-13 DIAGNOSIS — O9081 Anemia of the puerperium: Secondary | ICD-10-CM | POA: Diagnosis not present

## 2021-08-13 DIAGNOSIS — O411230 Chorioamnionitis, third trimester, not applicable or unspecified: Secondary | ICD-10-CM | POA: Diagnosis present

## 2021-08-13 DIAGNOSIS — Z3A40 40 weeks gestation of pregnancy: Secondary | ICD-10-CM | POA: Diagnosis not present

## 2021-08-13 DIAGNOSIS — O480 Post-term pregnancy: Secondary | ICD-10-CM | POA: Diagnosis not present

## 2021-08-13 DIAGNOSIS — O411290 Chorioamnionitis, unspecified trimester, not applicable or unspecified: Secondary | ICD-10-CM | POA: Diagnosis not present

## 2021-08-13 DIAGNOSIS — D649 Anemia, unspecified: Secondary | ICD-10-CM | POA: Diagnosis not present

## 2021-08-13 DIAGNOSIS — E669 Obesity, unspecified: Secondary | ICD-10-CM | POA: Diagnosis not present

## 2021-08-13 LAB — CBC WITH DIFFERENTIAL/PLATELET
Abs Immature Granulocytes: 0.06 10*3/uL (ref 0.00–0.07)
Basophils Absolute: 0 10*3/uL (ref 0.0–0.1)
Basophils Relative: 0 %
Eosinophils Absolute: 0 10*3/uL (ref 0.0–0.5)
Eosinophils Relative: 0 %
HCT: 35.1 % — ABNORMAL LOW (ref 36.0–46.0)
Hemoglobin: 11.7 g/dL — ABNORMAL LOW (ref 12.0–15.0)
Immature Granulocytes: 1 %
Lymphocytes Relative: 13 %
Lymphs Abs: 1.8 10*3/uL (ref 0.7–4.0)
MCH: 29.9 pg (ref 26.0–34.0)
MCHC: 33.3 g/dL (ref 30.0–36.0)
MCV: 89.8 fL (ref 80.0–100.0)
Monocytes Absolute: 0.6 10*3/uL (ref 0.1–1.0)
Monocytes Relative: 5 %
Neutro Abs: 10.8 10*3/uL — ABNORMAL HIGH (ref 1.7–7.7)
Neutrophils Relative %: 81 %
Platelets: 221 10*3/uL (ref 150–400)
RBC: 3.91 MIL/uL (ref 3.87–5.11)
RDW: 14.7 % (ref 11.5–15.5)
WBC: 13.3 10*3/uL — ABNORMAL HIGH (ref 4.0–10.5)
nRBC: 0 % (ref 0.0–0.2)

## 2021-08-13 LAB — RESP PANEL BY RT-PCR (FLU A&B, COVID) ARPGX2
Influenza A by PCR: NEGATIVE
Influenza B by PCR: NEGATIVE
SARS Coronavirus 2 by RT PCR: NEGATIVE

## 2021-08-13 LAB — TYPE AND SCREEN
ABO/RH(D): A POS
Antibody Screen: NEGATIVE

## 2021-08-13 LAB — ABO/RH: ABO/RH(D): A POS

## 2021-08-13 MED ORDER — AMMONIA AROMATIC IN INHA
RESPIRATORY_TRACT | Status: AC
  Filled 2021-08-13: qty 10

## 2021-08-13 MED ORDER — ONDANSETRON HCL 4 MG/2ML IJ SOLN: 4.0000 mg | Freq: Four times a day (QID) | INTRAMUSCULAR | Status: DC | PRN

## 2021-08-13 MED ORDER — OXYTOCIN-SODIUM CHLORIDE 30-0.9 UT/500ML-% IV SOLN
1.0000 m[IU]/min | INTRAVENOUS | Status: DC
  Administered 2021-08-13: 4 m[IU]/min via INTRAVENOUS

## 2021-08-13 MED ORDER — OXYTOCIN BOLUS FROM INFUSION
333.0000 mL | Freq: Once | INTRAVENOUS | Status: AC
  Administered 2021-08-14: 333 mL via INTRAVENOUS

## 2021-08-13 MED ORDER — LIDOCAINE HCL (PF) 1 % IJ SOLN
30.0000 mL | INTRAMUSCULAR | Status: DC | PRN
  Filled 2021-08-13: qty 30

## 2021-08-13 MED ORDER — FENTANYL-BUPIVACAINE-NACL 0.5-0.125-0.9 MG/250ML-% EP SOLN
EPIDURAL | Status: AC
  Filled 2021-08-13: qty 250

## 2021-08-13 MED ORDER — LIDOCAINE HCL (PF) 1 % IJ SOLN
INTRAMUSCULAR | Status: AC
  Filled 2021-08-13: qty 30

## 2021-08-13 MED ORDER — LACTATED RINGERS IV SOLN: 500.0000 mL | INTRAVENOUS | Status: DC | PRN

## 2021-08-13 MED ORDER — LIDOCAINE HCL (PF) 1 % IJ SOLN
INTRAMUSCULAR | Status: DC | PRN
  Administered 2021-08-13: 3 mL via SUBCUTANEOUS

## 2021-08-13 MED ORDER — GENTAMICIN SULFATE 40 MG/ML IJ SOLN
5.0000 mg/kg | Freq: Once | INTRAVENOUS | Status: AC
  Administered 2021-08-13: 380 mg via INTRAVENOUS
  Filled 2021-08-13: qty 9.5

## 2021-08-13 MED ORDER — OXYTOCIN 10 UNIT/ML IJ SOLN
INTRAMUSCULAR | Status: AC
  Filled 2021-08-13: qty 2

## 2021-08-13 MED ORDER — OXYTOCIN-SODIUM CHLORIDE 30-0.9 UT/500ML-% IV SOLN: 2.5000 [IU]/h | INTRAVENOUS | Status: DC

## 2021-08-13 MED ORDER — LACTATED RINGERS IV SOLN
500.0000 mL | Freq: Once | INTRAVENOUS | Status: AC
  Administered 2021-08-13: 500 mL via INTRAVENOUS

## 2021-08-13 MED ORDER — EPHEDRINE 5 MG/ML INJ
10.0000 mg | INTRAVENOUS | Status: DC | PRN
  Filled 2021-08-13: qty 2

## 2021-08-13 MED ORDER — OXYCODONE-ACETAMINOPHEN 5-325 MG PO TABS
2.0000 | ORAL_TABLET | ORAL | Status: DC | PRN
Start: 2021-08-13 — End: 2021-08-14

## 2021-08-13 MED ORDER — TERBUTALINE SULFATE 1 MG/ML IJ SOLN: 0.2500 mg | Freq: Once | INTRAMUSCULAR | Status: DC | PRN

## 2021-08-13 MED ORDER — ACETAMINOPHEN 325 MG PO TABS
650.0000 mg | ORAL_TABLET | ORAL | Status: DC | PRN
  Administered 2021-08-13 (×2): 650 mg via ORAL
  Filled 2021-08-13 (×2): qty 2

## 2021-08-13 MED ORDER — LIDOCAINE-EPINEPHRINE (PF) 1.5 %-1:200000 IJ SOLN
INTRAMUSCULAR | Status: DC | PRN
  Administered 2021-08-13: 3 mL via EPIDURAL

## 2021-08-13 MED ORDER — LACTATED RINGERS IV SOLN: INTRAVENOUS | Status: DC

## 2021-08-13 MED ORDER — BUPIVACAINE HCL (PF) 0.25 % IJ SOLN
INTRAMUSCULAR | Status: DC | PRN
  Administered 2021-08-13 (×2): 4 mL via EPIDURAL

## 2021-08-13 MED ORDER — OXYTOCIN-SODIUM CHLORIDE 30-0.9 UT/500ML-% IV SOLN
INTRAVENOUS | Status: AC
  Filled 2021-08-13: qty 1000

## 2021-08-13 MED ORDER — DIPHENHYDRAMINE HCL 50 MG/ML IJ SOLN
12.5000 mg | INTRAMUSCULAR | Status: DC | PRN
Start: 2021-08-13 — End: 2021-08-14
  Administered 2021-08-13: 12.5 mg via INTRAVENOUS
  Filled 2021-08-13: qty 1

## 2021-08-13 MED ORDER — SODIUM CHLORIDE 0.9 % IV SOLN
2.0000 g | Freq: Four times a day (QID) | INTRAVENOUS | Status: DC
  Administered 2021-08-13 – 2021-08-14 (×3): 2 g via INTRAVENOUS
  Filled 2021-08-13 (×4): qty 2000

## 2021-08-13 MED ORDER — PHENYLEPHRINE 40 MCG/ML (10ML) SYRINGE FOR IV PUSH (FOR BLOOD PRESSURE SUPPORT)
80.0000 ug | PREFILLED_SYRINGE | INTRAVENOUS | Status: DC | PRN
  Filled 2021-08-13: qty 10

## 2021-08-13 MED ORDER — SOD CITRATE-CITRIC ACID 500-334 MG/5ML PO SOLN: 30.0000 mL | ORAL | Status: DC | PRN

## 2021-08-13 MED ORDER — OXYCODONE-ACETAMINOPHEN 5-325 MG PO TABS: 1.0000 | ORAL_TABLET | ORAL | Status: DC | PRN

## 2021-08-13 MED ORDER — PHENYLEPHRINE 40 MCG/ML (10ML) SYRINGE FOR IV PUSH (FOR BLOOD PRESSURE SUPPORT)
80.0000 ug | PREFILLED_SYRINGE | INTRAVENOUS | Status: DC | PRN
Start: 2021-08-13 — End: 2021-08-14
  Filled 2021-08-13: qty 10

## 2021-08-13 MED ORDER — FENTANYL-BUPIVACAINE-NACL 0.5-0.125-0.9 MG/250ML-% EP SOLN
12.0000 mL/h | EPIDURAL | Status: DC | PRN
  Administered 2021-08-13: 12 mL/h via EPIDURAL

## 2021-08-13 MED ORDER — MISOPROSTOL 200 MCG PO TABS
ORAL_TABLET | ORAL | Status: AC
  Filled 2021-08-13: qty 4

## 2021-08-13 MED ORDER — BUTORPHANOL TARTRATE 1 MG/ML IJ SOLN: 1.0000 mg | INTRAMUSCULAR | Status: DC | PRN

## 2021-08-13 NOTE — Anesthesia Preprocedure Evaluation (Signed)
Anesthesia Evaluation  Patient identified by MRN, date of birth, ID band Patient awake    Reviewed: Allergy & Precautions, H&P , NPO status , Patient's Chart, lab work & pertinent test results  History of Anesthesia Complications Negative for: history of anesthetic complications  Airway Mallampati: II   Neck ROM: full    Dental no notable dental hx.    Pulmonary former smoker,           Cardiovascular Normal cardiovascular exam     Neuro/Psych PSYCHIATRIC DISORDERS Anxiety    GI/Hepatic   Endo/Other    Renal/GU      Musculoskeletal   Abdominal   Peds  Hematology   Anesthesia Other Findings   Reproductive/Obstetrics                             Anesthesia Physical Anesthesia Plan  ASA: 2  Anesthesia Plan: Epidural   Post-op Pain Management:    Induction:   PONV Risk Score and Plan:   Airway Management Planned:   Additional Equipment:   Intra-op Plan:   Post-operative Plan:   Informed Consent: I have reviewed the patients History and Physical, chart, labs and discussed the procedure including the risks, benefits and alternatives for the proposed anesthesia with the patient or authorized representative who has indicated his/her understanding and acceptance.       Plan Discussed with: Anesthesiologist  Anesthesia Plan Comments:         Anesthesia Quick Evaluation

## 2021-08-13 NOTE — Progress Notes (Signed)
Cervical exam unchanged since admission at approximately 10 a.m. Ctx irregular, q 2-5 min. Discussed options of AROM vs initiation of Pitocin. Patient ok to AROM. Moderate amount of thin meconium stained fluid present. Continue to monitor. Category I tracing.    Hildred Laser, MD [facility]

## 2021-08-13 NOTE — Progress Notes (Signed)
Intrapartum Progress Note  S: Patient resting comfortably.   O: Blood pressure 121/81, pulse 72, temperature 98.7 F (37.1 C), temperature source Oral, resp. rate 18, height 5\' 5"  (1.651 m), weight 103.9 kg, last menstrual period 11/04/2020, SpO2 97 %. Gen App: NAD, comfortable Abdomen: soft, gravid FHT: baseline 155 bpm.  Accels present.  Decels present - rare shallow variable deceleration present . moderate in degree variability.   Tocometer: contractions 1-7 min Cervix: 7.5/90/0. AROM'd at ~ 1315, meconium stained fluid.  Extremities: Nontender, no edema.  Pitocin: 8 mIU  Labs:  No new labs  Assessment:  1: SIUP at 110w2d 2.Chorioamnionitis  Plan:  1. Continue Pitocin for augmentation.  Cervical dilation change minimal but good fetal descent noted since last exam.  2 Chorioamnionitis, treating with ampicillin and gentamycin.  3. Overall remains Category 1 tracing at this time.    [redacted]w[redacted]d, MD 08/13/2021 10:25 PM

## 2021-08-13 NOTE — Progress Notes (Signed)
Intrapartum Progress Note  S: Patient notes feeling mild contractions with epidural.   O: Blood pressure 112/74, pulse 63, temperature (!) 100.5 F (38.1 C), temperature source Axillary, resp. rate 18, height 5\' 5"  (1.651 m), weight 103.9 kg, last menstrual period 11/04/2020, SpO2 100 %. Gen App: NAD, comfortable Abdomen: soft, gravid FHT: baseline 155 bpm.  Accels present.  Decels absent. moderate in degree variability.   Tocometer: contractions irregular, difficulty tracing as patient on her side. Cervix: 7/90/-2. AROM'd at ~ 1315, meconium stained fluid.  Extremities: Nontender, no edema.  Pitocin: None  Labs:  No new labs  Assessment:  1: SIUP at [redacted]w[redacted]d 2. Elevated temp x 1  Plan:  1. Will initiate Pitocin for augmentation of labor as no major cervical change noted.  2. Elevated temp x 1. Nurse reports foul-smelling odor 1-2 hrs after AROM.  Will proceed with treatment for chorioamnionitis, ampicillin and gentamycin.  3. Category 1 tracing at this time.    [redacted]w[redacted]d, MD 08/13/2021 5:50 PM

## 2021-08-13 NOTE — Anesthesia Procedure Notes (Signed)
Epidural Patient location during procedure: OB Start time: 08/13/2021 11:50 AM End time: 08/13/2021 12:03 PM  Staffing Anesthesiologist: Lenard Simmer, MD Resident/CRNA: Irving Burton, CRNA Performed: resident/CRNA   Preanesthetic Checklist Completed: patient identified, IV checked, site marked, risks and benefits discussed, surgical consent, monitors and equipment checked, pre-op evaluation and timeout performed  Epidural Patient position: sitting Prep: ChloraPrep Patient monitoring: heart rate, continuous pulse ox and blood pressure Approach: midline Location: L3-L4 Injection technique: LOR air  Needle:  Needle type: Tuohy  Needle gauge: 17 G Needle length: 9 cm and 9 Needle insertion depth: 8 cm Catheter type: closed end flexible Catheter size: 19 Gauge Catheter at skin depth: 12 cm Test dose: negative and 1.5% lidocaine with Epi 1:200 K  Assessment Sensory level: T10 Events: blood not aspirated, injection not painful, no injection resistance, no paresthesia and negative IV test  Additional Notes 1 attempt Pt. Evaluated and documentation done after procedure finished. Patient identified. Risks/Benefits/Options discussed with patient including but not limited to bleeding, infection, nerve damage, paralysis, failed block, incomplete pain control, headache, blood pressure changes, nausea, vomiting, reactions to medication both or allergic, itching and postpartum back pain. Confirmed with bedside nurse the patient's most recent platelet count. Confirmed with patient that they are not currently taking any anticoagulation, have any bleeding history or any family history of bleeding disorders. Patient expressed understanding and wished to proceed. All questions were answered. Sterile technique was used throughout the entire procedure. Please see nursing notes for vital signs. Test dose was given through epidural catheter and negative prior to continuing to dose epidural or start  infusion. Warning signs of high block given to the patient including shortness of breath, tingling/numbness in hands, complete motor block, or any concerning symptoms with instructions to call for help. Patient was given instructions on fall risk and not to get out of bed. All questions and concerns addressed with instructions to call with any issues or inadequate analgesia.    Patient tolerated the insertion well without immediate complications.Reason for block:procedure for pain

## 2021-08-13 NOTE — H&P (Signed)
Obstetric History and Physical  Jennifer Harper is a 30 y.o. G1P0 with IUP at [redacted]w[redacted]d presenting for complaints of worsening contractions since last night. She presented in triage last night due to complaints of irregular contractions. Was noted to be ~ 3 cm, with no further cervical change and was discharged home.  She has now been having  regular, every 3-5 minutes contractions,  no  vaginal bleeding, with intact membranes, and active fetal movement.    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   Prenatal labs and studies: ABO, Rh: --/--/A POS (10/06 1046) Antibody: NEG (10/06 1046) Rubella: 1.86 (03/11 1040) RPR: Non Reactive (08/16 1014)  HBsAg: Negative (03/11 1040)  HIV: Non Reactive (03/11 1040)  JAS:NKNLZJQB/-- (09/07 1434) 1 hr Glucola  normal Genetic screening normal Anatomy US normal    Past Medical History:  Diagnosis Date   Anxiety     Past Surgical History:  Procedure Laterality Date   BACK SURGERY      OB History  Gravida Para Term Preterm AB Living  1            SAB IAB Ectopic Multiple Live Births               # Outcome Date GA Lbr Len/2nd Weight Sex Delivery Anes PTL Lv  1 Current             Social History   Socioeconomic History   Marital status: Single    Spouse name: Not on file   Number of children: Not on file   Years of education: Not on file   Highest education level: Not on file  Occupational History   Not on file  Tobacco Use   Smoking status:  Former    Types: Cigarettes    Quit date: 2011    Years since quitting: 11.7   Smokeless tobacco: Current   Tobacco comments:    vapes  Vaping Use   Vaping Use: Former   Substances: Nicotine  Substance and Sexual Activity   Alcohol use: Not Currently    Alcohol/week: 3.0 standard drinks    Types: 3 Glasses of wine per week   Drug use: No   Sexual activity: Yes    Birth control/protection: None  Other Topics Concern   Not on file  Social History Narrative   Not on file   Social Determinants of Health   Financial Resource Strain: Not on file  Food Insecurity: Not on file  Transportation Needs: Not on file  Physical Activity: Not on file  Stress: Not on file  Social Connections: Not on file    Family History  Problem Relation Age of Onset   Clotting disorder Mother    Anemia Mother    Heart disease Father    Hypertension Father     Medications Prior to Admission  Medication Sig Dispense Refill Last Dose   Prenatal Vit-Fe Fumarate-FA (PRENATAL VITAMINS PO) Take by mouth.   Past Week    Allergies  Allergen Reactions   Paroxetine  Hcl Other (See Comments)    bruising bruising     Review of Systems: Negative except for what is mentioned in HPI.  Physical Exam: BP 131/77 (BP Location: Right Arm)   Pulse 78   Temp 98.5 F (36.9 C) (Oral)   Resp 18   Ht 5\' 5"  (1.651 m)   Wt 103.9 kg   LMP 11/04/2020 (Exact Date)   SpO2 98%   BMI 38.11 kg/m  CONSTITUTIONAL: Well-developed, well-nourished female in no acute distress.  HENT:  Normocephalic, atraumatic, External right and left ear normal. Oropharynx is clear and moist EYES: Conjunctivae and EOM are normal. Pupils are equal, round, and reactive to light. No scleral icterus.  NECK: Normal range of motion, supple, no masses SKIN: Skin is warm and dry. No rash noted. Not diaphoretic. No erythema. No pallor. NEUROLOGIC: Alert and oriented to person, place, and time. Normal reflexes, muscle tone coordination. No  cranial nerve deficit noted. PSYCHIATRIC: Normal mood and affect. Normal behavior. Normal judgment and thought content. CARDIOVASCULAR: Normal heart rate noted, regular rhythm RESPIRATORY: Effort and breath sounds normal, no problems with respiration noted ABDOMEN: Soft, nontender, nondistended, gravid. MUSCULOSKELETAL: Normal range of motion. No edema and no tenderness. 2+ distal pulses.  Cervical Exam: Dilatation 7 cm   Effacement 90%   Station unable to determine due to bulging membranes.   Presentation: cephalic FHT:  Baseline rate 150 bpm   Variability moderate  Accelerations present   Decelerations none Contractions: Every 2-5 mins   Pertinent Labs/Studies:   Results for orders placed or performed during the hospital encounter of 08/13/21 (from the past 24 hour(s))  Resp Panel by RT-PCR (Flu A&B, Covid) Nasopharyngeal Swab     Status: None   Collection Time: 08/13/21 10:14 AM   Specimen: Nasopharyngeal Swab; Nasopharyngeal(NP) swabs in vial transport medium  Result Value Ref Range   SARS Coronavirus 2 by RT PCR NEGATIVE NEGATIVE   Influenza A by PCR NEGATIVE NEGATIVE   Influenza B by PCR NEGATIVE NEGATIVE  CBC with Differential/Platelet     Status: Abnormal   Collection Time: 08/13/21 10:14 AM  Result Value Ref Range   WBC 13.3 (H) 4.0 - 10.5 K/uL   RBC 3.91 3.87 - 5.11 MIL/uL   Hemoglobin 11.7 (L) 12.0 - 15.0 g/dL   HCT 10/13/21 (L) 01.7 - 79.3 %   MCV 89.8 80.0 - 100.0 fL   MCH 29.9 26.0 - 34.0 pg   MCHC 33.3 30.0 - 36.0 g/dL   RDW 90.3 00.9 - 23.3 %   Platelets 221 150 - 400 K/uL   nRBC 0.0 0.0 - 0.2 %   Neutrophils Relative % 81 %   Neutro Abs 10.8 (H) 1.7 - 7.7 K/uL   Lymphocytes Relative 13 %   Lymphs Abs 1.8 0.7 - 4.0 K/uL   Monocytes Relative 5 %   Monocytes Absolute 0.6 0.1 - 1.0 K/uL   Eosinophils Relative 0 %   Eosinophils Absolute 0.0 0.0 - 0.5 K/uL   Basophils Relative 0 %   Basophils Absolute 0.0 0.0 - 0.1 K/uL   Immature Granulocytes 1 %   Abs Immature  Granulocytes 0.06 0.00 - 0.07 K/uL  Type and screen [facility]     Status: None   Collection Time: 08/13/21 10:46 AM  Result Value Ref Range   ABO/RH(D) A POS    Antibody Screen NEG    Sample Expiration      08/16/2021,2359 Performed at [facility], [address] [place],  [place] [address]     Assessment : Jennifer Harper is a 31 y.o. G1P0 at [redacted]w[redacted]d being admitted for labor.  History of anxiety (no meds). Vaping status.   Plan: Labor: Expectant management. Augmentation with pitocin as needed per protocol. Patient s/p recent epidural placement. Will wait and reassess cervical exam.  Offered AROM if no further cervical change vs initiation of Pitocin.   FWB: Reassuring fetal heart tracing.  GBS negative Delivery plan: Hopeful for vaginal delivery   Hildred Laser, MD [facility]

## 2021-08-14 ENCOUNTER — Encounter: Payer: Self-pay | Admitting: Obstetrics and Gynecology

## 2021-08-14 ENCOUNTER — Encounter: Payer: PRIVATE HEALTH INSURANCE | Admitting: Certified Nurse Midwife

## 2021-08-14 DIAGNOSIS — O480 Post-term pregnancy: Secondary | ICD-10-CM

## 2021-08-14 DIAGNOSIS — E669 Obesity, unspecified: Secondary | ICD-10-CM

## 2021-08-14 DIAGNOSIS — Z3403 Encounter for supervision of normal first pregnancy, third trimester: Secondary | ICD-10-CM

## 2021-08-14 DIAGNOSIS — Z3A40 40 weeks gestation of pregnancy: Secondary | ICD-10-CM

## 2021-08-14 DIAGNOSIS — O99214 Obesity complicating childbirth: Secondary | ICD-10-CM

## 2021-08-14 DIAGNOSIS — O9903 Anemia complicating the puerperium: Secondary | ICD-10-CM

## 2021-08-14 DIAGNOSIS — D649 Anemia, unspecified: Secondary | ICD-10-CM

## 2021-08-14 DIAGNOSIS — O411230 Chorioamnionitis, third trimester, not applicable or unspecified: Secondary | ICD-10-CM

## 2021-08-14 LAB — RPR
RPR Ser Ql: NONREACTIVE
RPR Ser Ql: NONREACTIVE

## 2021-08-14 MED ORDER — WITCH HAZEL-GLYCERIN EX PADS
1.0000 "application " | MEDICATED_PAD | CUTANEOUS | Status: DC | PRN
  Filled 2021-08-14: qty 100

## 2021-08-14 MED ORDER — ONDANSETRON HCL 4 MG/2ML IJ SOLN: 4.0000 mg | INTRAMUSCULAR | Status: DC | PRN

## 2021-08-14 MED ORDER — ACETAMINOPHEN 325 MG PO TABS
650.0000 mg | ORAL_TABLET | ORAL | Status: DC | PRN
  Administered 2021-08-14: 650 mg via ORAL
  Filled 2021-08-14: qty 2

## 2021-08-14 MED ORDER — SIMETHICONE 80 MG PO CHEW: 80.0000 mg | CHEWABLE_TABLET | ORAL | Status: DC | PRN

## 2021-08-14 MED ORDER — SENNOSIDES-DOCUSATE SODIUM 8.6-50 MG PO TABS
2.0000 | ORAL_TABLET | Freq: Every day | ORAL | Status: DC
  Administered 2021-08-15: 2 via ORAL
  Filled 2021-08-14: qty 2

## 2021-08-14 MED ORDER — COCONUT OIL OIL: 1.0000 "application " | TOPICAL_OIL | Status: DC | PRN

## 2021-08-14 MED ORDER — ONDANSETRON HCL 4 MG PO TABS: 4.0000 mg | ORAL_TABLET | ORAL | Status: DC | PRN

## 2021-08-14 MED ORDER — BENZOCAINE-MENTHOL 20-0.5 % EX AERO
1.0000 "application " | INHALATION_SPRAY | CUTANEOUS | Status: DC | PRN
  Filled 2021-08-14: qty 56

## 2021-08-14 MED ORDER — DIPHENHYDRAMINE HCL 25 MG PO CAPS: 25.0000 mg | ORAL_CAPSULE | Freq: Four times a day (QID) | ORAL | Status: DC | PRN

## 2021-08-14 MED ORDER — DIBUCAINE (PERIANAL) 1 % EX OINT
1.0000 "application " | TOPICAL_OINTMENT | CUTANEOUS | Status: DC | PRN
  Filled 2021-08-14: qty 28

## 2021-08-14 MED ORDER — ZOLPIDEM TARTRATE 5 MG PO TABS: 5.0000 mg | ORAL_TABLET | Freq: Every evening | ORAL | Status: DC | PRN

## 2021-08-14 MED ORDER — PRENATAL MULTIVITAMIN CH
1.0000 | ORAL_TABLET | Freq: Every day | ORAL | Status: DC
  Administered 2021-08-14 – 2021-08-15 (×2): 1 via ORAL
  Filled 2021-08-14 (×2): qty 1

## 2021-08-14 MED ORDER — SODIUM CHLORIDE 0.9 % IV SOLN
INTRAVENOUS | Status: AC
  Filled 2021-08-14: qty 2000

## 2021-08-14 MED ORDER — IBUPROFEN 600 MG PO TABS
600.0000 mg | ORAL_TABLET | Freq: Four times a day (QID) | ORAL | Status: DC
  Administered 2021-08-14 – 2021-08-15 (×5): 600 mg via ORAL
  Filled 2021-08-14 (×6): qty 1

## 2021-08-15 DIAGNOSIS — O9081 Anemia of the puerperium: Secondary | ICD-10-CM | POA: Diagnosis not present

## 2021-08-15 DIAGNOSIS — O411290 Chorioamnionitis, unspecified trimester, not applicable or unspecified: Secondary | ICD-10-CM | POA: Diagnosis not present

## 2021-08-15 LAB — CBC
HCT: 24.9 % — ABNORMAL LOW (ref 36.0–46.0)
Hemoglobin: 8 g/dL — ABNORMAL LOW (ref 12.0–15.0)
MCH: 29.6 pg (ref 26.0–34.0)
MCHC: 32.1 g/dL (ref 30.0–36.0)
MCV: 92.2 fL (ref 80.0–100.0)
Platelets: 180 10*3/uL (ref 150–400)
RBC: 2.7 MIL/uL — ABNORMAL LOW (ref 3.87–5.11)
RDW: 15.1 % (ref 11.5–15.5)
WBC: 12.5 10*3/uL — ABNORMAL HIGH (ref 4.0–10.5)
nRBC: 0 % (ref 0.0–0.2)

## 2021-08-15 MED ORDER — FERROUS SULFATE 325 (65 FE) MG PO TABS: 325.0000 mg | ORAL_TABLET | Freq: Two times a day (BID) | ORAL | 1 refills | Status: DC

## 2021-08-15 MED ORDER — DOCUSATE SODIUM 100 MG PO CAPS: 100.0000 mg | ORAL_CAPSULE | Freq: Two times a day (BID) | ORAL | 2 refills | Status: DC | PRN

## 2021-08-15 MED ORDER — IBUPROFEN 600 MG PO TABS
600.0000 mg | ORAL_TABLET | Freq: Four times a day (QID) | ORAL | 0 refills | Status: DC | PRN
Start: 2021-08-15 — End: 2021-09-29

## 2021-08-15 NOTE — Progress Notes (Signed)
Post Partum Day # 1, s/p SVD.   Subjective: no complaints, up ad lib, voiding, and tolerating PO  Objective: Temp:  [97.4 F (36.3 C)-98.1 F (36.7 C)] 97.8 F (36.6 C) (10/08 0828) Pulse Rate:  [72-82] 72 (10/08 0828) Resp:  [17-18] 18 (10/08 0828) BP: (101-112)/(65-70) 112/70 (10/08 0828) SpO2:  [98 %-100 %] 100 % (10/08 0828)  Physical Exam:  General: alert and no distress  Lungs: clear to auscultation bilaterally Breasts: normal appearance, no masses or tenderness Heart: regular rate and rhythm, S1, S2 normal, no murmur, click, rub or gallop Abdomen: soft, non-tender; bowel sounds normal; no masses,  no organomegaly Pelvis: Lochia: appropriate, Uterine Fundus: firm Extremities: DVT Evaluation: No evidence of DVT seen on physical exam. Negative Homan's sign. No cords or calf tenderness. No significant calf/ankle edema.  Recent Labs    08/13/21 1014 08/15/21 0518  HGB 11.7* 8.0*  HCT 35.1* 24.9*    Assessment/Plan: Doing well postpartum Breastfeeding, Lactation consult Contraception: undecided, but leaning towards hormonal IUD.  Formula feeding Anemia postpartum, asymptomatic. Likely from acute blood loss. Chorioamnionitis in labor. Remains asymptomatic.  Discharge home today.    LOS: 2 days   Hildred Laser, MD [facility] 08/15/2021 11:32 AM

## 2021-08-15 NOTE — Anesthesia Postprocedure Evaluation (Signed)
Anesthesia Post Note  Patient: Jennifer Harper  Procedure(s) Performed: AN AD HOC LABOR EPIDURAL  Patient location during evaluation: Mother Baby Anesthesia Type: Epidural Level of consciousness: awake and alert Pain management: pain level controlled Vital Signs Assessment: post-procedure vital signs reviewed and stable Respiratory status: spontaneous breathing, nonlabored ventilation and respiratory function stable Cardiovascular status: stable Postop Assessment: no headache, no backache and epidural receding Anesthetic complications: no   No notable events documented.   Last Vitals:  Vitals:   08/14/21 1627 08/14/21 2335  BP: 112/65 112/69  Pulse: 81 82  Resp: 18 17  Temp: (!) 36.3 C 36.5 C  SpO2: 100% 99%    Last Pain:  Vitals:   08/15/21 0504  TempSrc:   PainSc: 0-No pain                 Lotus Santillo,  Alessandra Bevels

## 2021-08-15 NOTE — Discharge Summary (Signed)
Postpartum Discharge Summary     Patient Name: Jennifer Harper DOB: [birth year] MRN: [id number]  Date of admission: 08/13/2021 Delivery date:08/14/2021  Delivering provider: Rubie Maid  Date of discharge: 08/15/2021  Admitting diagnosis: Normal labor [O80, Z37.9] Intrauterine pregnancy: [redacted]w[redacted]d    Secondary diagnosis:  Principal Problem:   Normal labor Active Problems:   Increased BMI   Anemia, postpartum   Chorioamnionitis  Additional problems: History of mood disorder, Postpartum anemia    Discharge diagnosis: Term Pregnancy Delivered and Anemia                                              Post partum procedures: None Augmentation: AROM and Pitocin Complications: Intrauterine Inflammation or infection (Chorioamniotis)  Hospital course: Onset of Labor With Vaginal Delivery      30y.o. yo G1P1001 at 419w3das admitted in Active Labor on 08/13/2021. Patient had an uncomplicated labor course as follows:  Membrane Rupture Time/Date: 1:26 PM ,08/13/2021   Delivery Method:Vaginal, Spontaneous  Episiotomy:   Lacerations:  2nd degree  Patient had an uncomplicated postpartum course.  She is ambulating, tolerating a regular diet, passing flatus, and urinating well. Patient is discharged home in stable condition on 08/16/21.  Newborn Data: Birth date:08/14/2021  Birth time:1:24 AM  Gender:Female  Living status:Living  Apgars:8 ,9  Weight:3490 g   Magnesium Sulfate received: No BMZ received: No Rhophylac:No MMR:No T-DaP:Given prenatally Flu: No Transfusion:No  Physical exam  Vitals:   08/14/21 1144 08/14/21 1627 08/14/21 2335 08/15/21 0828  BP: 101/70 112/65 112/69 112/70  Pulse: 77 81 82 72  Resp: 18 18 17 18   Temp: 98.1 F (36.7 C) (!) 97.4 F (36.3 C) 97.7 F (36.5 C) 97.8 F (36.6 C)  TempSrc: Oral Oral Oral Oral  SpO2: 98% 100% 99% 100%  Weight:      Height:       General: alert, cooperative, and no distress Lochia: appropriate Uterine Fundus:  firm Incision: N/A DVT Evaluation: No evidence of DVT seen on physical exam. Negative Homan's sign. No cords or calf tenderness. No significant calf/ankle edema.   Labs: Lab Results  Component Value Date   WBC 12.5 (H) 08/15/2021   HGB 8.0 (L) 08/15/2021   HCT 24.9 (L) 08/15/2021   MCV 92.2 08/15/2021   PLT 180 08/15/2021   CMP Latest Ref Rng & Units 03/27/2018  Glucose 65 - 99 mg/dL 112(H)  BUN 6 - 20 mg/dL 9  Creatinine 0.44 - 1.00 mg/dL 0.75  Sodium 135 - 145 mmol/L 137  Potassium 3.5 - 5.1 mmol/L 3.4(L)  Chloride 101 - 111 mmol/L 104  CO2 22 - 32 mmol/L 20(L)  Calcium 8.9 - 10.3 mg/dL 9.1  Total Protein 6.5 - 8.1 g/dL 7.1  Total Bilirubin 0.3 - 1.2 mg/dL 1.2  Alkaline Phos 38 - 126 U/L 45  AST 15 - 41 U/L 19  ALT 14 - 54 U/L 13(L)   Edinburgh Score: Edinburgh Postnatal Depression Scale Screening Tool 08/14/2021  I have been able to laugh and see the funny side of things. 0  I have looked forward with enjoyment to things. 0  I have blamed myself unnecessarily when things went wrong. 2  I have been anxious or worried for no good reason. 2  I have felt scared or panicky for no good reason. 2  Things have been getting  on top of me. 1  I have been so unhappy that I have had difficulty sleeping. 0  I have felt sad or miserable. 0  I have been so unhappy that I have been crying. 1  The thought of harming myself has occurred to me. 0  Edinburgh Postnatal Depression Scale Total 8      After visit meds:  Allergies as of 08/15/2021       Reactions   Paroxetine Hcl Other (See Comments)   bruising bruising        Medication List     TAKE these medications    docusate sodium 100 MG capsule Commonly known as: COLACE Take 1 capsule (100 mg total) by mouth 2 (two) times daily as needed.   ferrous sulfate 325 (65 FE) MG tablet Commonly known as: FerrouSul Take 1 tablet (325 mg total) by mouth 2 (two) times daily.   ibuprofen 600 MG tablet Commonly known as:  ADVIL Take 1 tablet (600 mg total) by mouth every 6 (six) hours as needed.   PRENATAL VITAMINS PO Take by mouth.         Discharge home in stable condition Infant Feeding: Bottle Infant Disposition:home with mother Discharge instruction: per After Visit Summary and Postpartum booklet. Activity: Advance as tolerated. Pelvic rest for 6 weeks.  Diet: routine diet Anticipated Birth Control: IUD and Unsure Postpartum Appointment:6 weeks Additional Postpartum F/U: Postpartum Depression checkup Future Appointments:No future appointments. Follow up Visit:  Follow-up Information     Philip Aspen, CNM Follow up today.   Specialties: Certified Nurse Midwife, Radiology Why: 2 weeks video visit (postpartum mood check) 6 week postpartum visit Contact information: [address] [place] [address] [phone]                     08/15/2021 Rubie Maid, MD

## 2021-08-15 NOTE — Progress Notes (Signed)
Patient d/c home with infant. D/c instructions, Rx, and f/u appt given to and reviewed with pt. Pt verbalized understanding. Escorted out by auxillary.   

## 2021-09-02 ENCOUNTER — Telehealth: Payer: BC Managed Care – PPO | Admitting: Obstetrics and Gynecology

## 2021-09-29 ENCOUNTER — Other Ambulatory Visit: Payer: Self-pay

## 2021-09-29 ENCOUNTER — Ambulatory Visit: Payer: BC Managed Care – PPO | Admitting: Obstetrics and Gynecology

## 2021-09-29 ENCOUNTER — Encounter: Payer: Self-pay | Admitting: Obstetrics and Gynecology

## 2021-09-29 DIAGNOSIS — Z23 Encounter for immunization: Secondary | ICD-10-CM

## 2021-09-29 DIAGNOSIS — F53 Postpartum depression: Secondary | ICD-10-CM | POA: Diagnosis not present

## 2021-09-29 DIAGNOSIS — Z3009 Encounter for other general counseling and advice on contraception: Secondary | ICD-10-CM

## 2021-09-29 DIAGNOSIS — O9081 Anemia of the puerperium: Secondary | ICD-10-CM | POA: Diagnosis not present

## 2021-09-29 MED ORDER — SERTRALINE HCL 100 MG PO TABS: 100.0000 mg | ORAL_TABLET | Freq: Every day | ORAL | 1 refills | Status: AC

## 2021-09-29 NOTE — Patient Instructions (Signed)
Perinatal Mood and Anxiety Disorder Perinatal mood and anxiety disorder (PMAD) is a mental health condition that happens when a person feels excessive sadness, anger, or worry and tension (anxiety) during pregnancy or during the first few months after the birth. This condition can last a few months or may continue for years if left untreated. PMAD may cause serious problems for the mother, her baby, or the father if not properly managed. Depression and anxiety can interfere with the ability to take care of the baby. It also may affect work, school, relationships, and other everyday activities. Having the baby blues is considered normal. Mild to moderate levels of sadness, exhaustion, and generally struggling with being a parent are considered the blues. Many parents experience these during the first 1-2 weeks after giving birth. If these symptoms become worse or last too long, it may be PMAD. What are the causes? The exact cause of this condition is not known. It may result from a combination of hormone changes and biological, social, and psychological factors. What increases the risk? The following factors may make you more likely to develop this condition: Having a personal or family history of depression, anxiety, or mood disorders. Experiencing a stressful life event during pregnancy, such as the death of a loved one. Having additional life stress, such as being a single parent. Having thyroid problems. What are the signs or symptoms? Symptoms of this condition include: Physical symptoms, such as: Panic attacks. These are intense episodes of fear or discomfort that may also cause sweating, nausea, shortness of breath, or fear of dying. They usually last 5-15 minutes but can last longer. Performing repetitive tasks to relieve stress or worry (obsessive compulsive disorder, or OCD). Problems with appetite or sleep. Emotional symptoms, such as: Excessive worry about problems or feeling like  something bad will happen (generalized anxiety disorder). Phobias, which are fears of certain objects or situations. Separation anxiety, or fear and stress about leaving certain people or loved ones. Behavioral symptoms, such as: Depression, or lack of motivation and energy. Intense mood swings involving emotional highs and lows. Feeling out of control or like you are going crazy. Having difficulty bonding with your baby. Some people also have trouble relaxing, problems concentrating, problems sleeping, frequent nightmares, and disturbing thoughts. PMAD can be different for everyone and can affect men as well as women. How is this diagnosed? This condition is diagnosed based on a physical exam and mental evaluation. In some cases, your health care provider may use an anxiety or depression screening tool. This includes a list of questions that can help a health care provider diagnose PMAD. You may be referred to a mental health expert who specializes in treating PMAD. How is this treated? This condition may be treated with: Talk therapy with a mental health professional. This may be family therapy, marriage therapy, cognitive behavioral therapy, or interpersonal therapy. Medicines. Your health care provider will discuss medicines that are safe to use during pregnancy and breastfeeding. Stress reduction therapies, such as mindfulness, deep breathing, or guided muscle relaxation. Support groups, early childhood education, or other groups to help with being a parent. Follow these instructions at home: Lifestyle Do not use any products that contain nicotine or tobacco. These products include cigarettes, chewing tobacco, and vaping devices, such as e-cigarettes. If you need help quitting, ask your health care provider. Do not drink alcohol when you are pregnant. It is also safest not to drink alcohol if you are breastfeeding. After your baby is born, if you  drink alcohol: Limit how much you have to  0-1 drink a day. Be aware of how much alcohol is in your drink. In the [place] one drink equals one 12 oz bottle of beer (355 mL), one 5 oz glass of wine (148 mL), or one 1 oz glass of hard liquor (44 mL). Consider joining a support group for new mothers. Ask your health care provider for recommendations. Take good care of yourself. Make sure you: Get as much rest as possible. Talk with your partner about sharing the responsibility of getting up with your baby if possible. Make sleep a priority. Eat a healthy diet. This includes plenty of fruits and vegetables, whole grains, and lean proteins. Exercise regularly, as told by your health care provider. Ask your health care provider what exercises are safe for you. Talk with your partner about making sure you both have opportunities to exercise. General instructions Take over-the-counter and prescription medicines only as told by your health care provider. Talk with your partner or family members about your feelings during pregnancy. Share your concerns, needs, or anxieties with each other. Do not be afraid to ask for help. Find a mental health professional, if needed. Ask for help with tasks or chores when you need it. Ask friends and family members to provide meals, watch your children, or help with cleaning. If friends or family are not able to help, consider finding a licensed child care provider or professional house cleaner if needed. Let your partner know what you need. He or she may be struggling too. Keep all follow-up visits. This is important. Contact a health care provider if: You or people close to you notice that you have symptoms of anxiety or depression. Your symptoms of anxiety or depression get worse. You take medicines and have side effects that are uncomfortable or difficult to tolerate. Get help right away if: You feel like hurting yourself, your baby, or someone else. If you feel like you may hurt yourself or others, or have  thoughts about taking your own life, get help right away. Go to your nearest emergency department or: Call your local emergency services (911 in the [place] Call a suicide crisis helpline, such as the National Suicide Prevention Lifeline at 934-499-0885 or 988 in the [place] This is open 24 hours a day in the [place] Text the Crisis Text Line at (785)177-3088 (in the [place] Summary Perinatal mood and anxiety disorder (PMAD) is when a woman or her partner feels excessive sadness, anger, or worry and tension (anxiety) during pregnancy or during the first few months after the birth. PMAD may include depression, intense mood swings, panic attacks, separation anxiety, phobias, or generalized anxiety. PMAD can cause problems for the mother, the baby, or the father if not properly managed. This condition is treated with medicines, talk therapy, stress reduction therapies, or a combination of treatments. Talk with your partner or family members about your concerns or fears. Ask for help when you need it. This information is not intended to replace advice given to you by your health care provider. Make sure you discuss any questions you have with your health care provider. Document Revised: 05/20/2021 Document Reviewed: 04/18/2020 Elsevier Patient Education  2022 [employer].

## 2021-09-29 NOTE — Progress Notes (Signed)
   OBSTETRICS POSTPARTUM CLINIC PROGRESS NOTE  Subjective:     Jennifer Harper is a 30 y.o. G60P1001 female who presents for a postpartum visit. She is 6 weeks postpartum following a spontaneous vaginal delivery. I have fully reviewed the prenatal and intrapartum course. The delivery was at 40 gestational weeks, complicated by chorioamnionitis.  Anesthesia: epidural. Postpartum course has been complicated by irritability and depression, she began taking an old prescription of Zoloft (50 mg) ~ 2 weeks postpartum. Baby's course has been well. Baby is feeding by bottle - Enfamil Gentle Ease . Bleeding: patient has resumed menses, with last menstrual period possibly today, 09/29/2021  Bowel function is normal. Bladder function is normal. Patient is not sexually active. Contraception method desired is  Nuvaring or IUD .  Depression screening is positive. EDPS score is 16.    The following portions of the patient's history were reviewed and updated as appropriate: allergies, current medications, past family history, past medical history, past social history, past surgical history, and problem list.  Review of Systems Pertinent items noted in HPI and remainder of comprehensive ROS otherwise negative.   Objective:    BP (!) 88/61   Pulse 77   Ht 5\' 5"  (1.651 m)   Wt 199 lb 9.3 oz (90.5 kg)   LMP 09/29/2021 (Exact Date)   Breastfeeding No   BMI 33.21 kg/m   General:  alert and no distress   Breasts:  inspection negative, no nipple discharge or bleeding, no masses or nodularity palpable  Lungs: clear to auscultation bilaterally  Heart:  regular rate and rhythm, S1, S2 normal, no murmur, click, rub or gallop  Abdomen: soft, non-tender; bowel sounds normal; no masses,  no organomegaly.     Vulva:  normal  Vagina: normal vagina, no discharge, exudate, lesion, or erythema  Cervix:  no cervical motion tenderness and no lesions  Corpus: normal size, contour, position, consistency, mobility,  non-tender  Adnexa:  normal adnexa and no mass, fullness, tenderness  Rectal Exam: Not performed.         Labs:  Lab Results  Component Value Date   HGB 8.0 (L) 08/15/2021    Assessment:   1. Postpartum care following vaginal delivery   2. General counseling and advice on contraceptive management   3. Anemia, postpartum   4. Postpartum depression   5. Needs flu shot     Plan:   1. Contraception: Counseling provided on both methods. After discussion, patient opts for Mirena IUD.  2. Will check Hgb for h/o postpartum anemia of less than 10.  3.  Postpartum depression, will increase dose to 100 mg. New prescription given. Will f/reassess symptoms in 2-3 week. Also plans to see her PCP in the next few weeks as well.  4.  Flu vaccine given today.  5. Follow up in:  4-6  months for annual exam, or sooner as needed.    10/15/2021, MD [facility]

## 2021-10-08 ENCOUNTER — Encounter: Payer: Self-pay | Admitting: Obstetrics and Gynecology

## 2021-10-21 ENCOUNTER — Encounter: Payer: BC Managed Care – PPO | Admitting: Obstetrics and Gynecology

## 2021-11-11 ENCOUNTER — Encounter: Payer: Self-pay | Admitting: Obstetrics and Gynecology

## 2021-11-12 ENCOUNTER — Encounter: Payer: BC Managed Care – PPO | Admitting: Obstetrics and Gynecology

## 2021-11-12 MED ORDER — ETONOGESTREL-ETHINYL ESTRADIOL 0.12-0.015 MG/24HR VA RING: VAGINAL_RING | VAGINAL | 4 refills | Status: AC

## 2022-04-20 ENCOUNTER — Encounter: Payer: Self-pay | Admitting: Obstetrics and Gynecology

## 2022-05-09 IMAGING — US US OB COMP LESS 14 WK
1 series · 14 of 28 positions shown · non-contrast
Comparison: None.

CLINICAL DATA: Pregnancy

EXAM:
OBSTETRIC <14 WK ULTRASOUND
TECHNIQUE: Transabdominal ultrasound was performed for evaluation of the
gestation as well as the maternal uterus and adnexal regions.

[Series 1: us ob comp less 14 wk · 0.26mm/px · 14 of 74 slices shown]
[im 3/74]
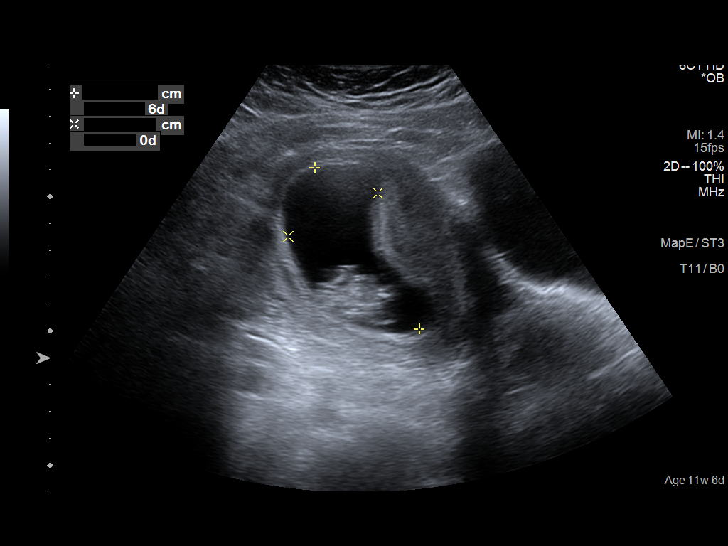
[im 9/74]
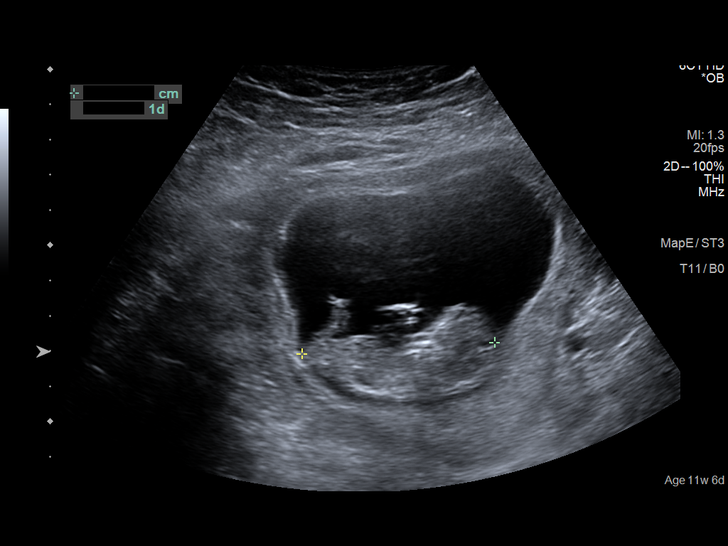
[im 14/74]
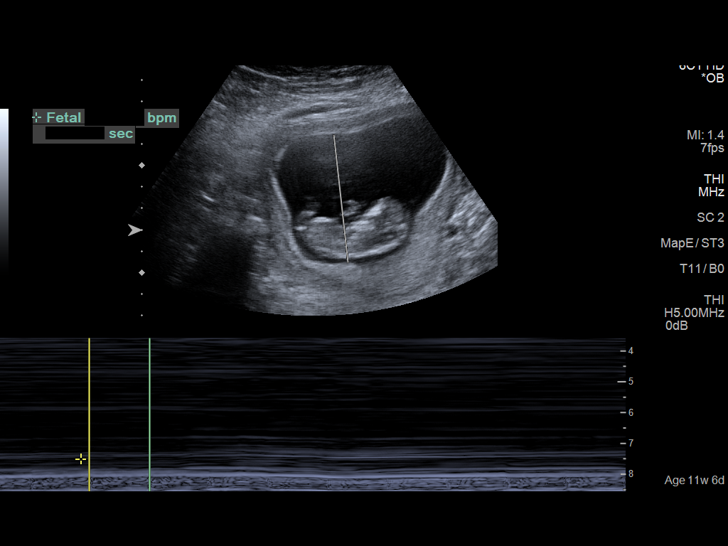
[im 19/74]
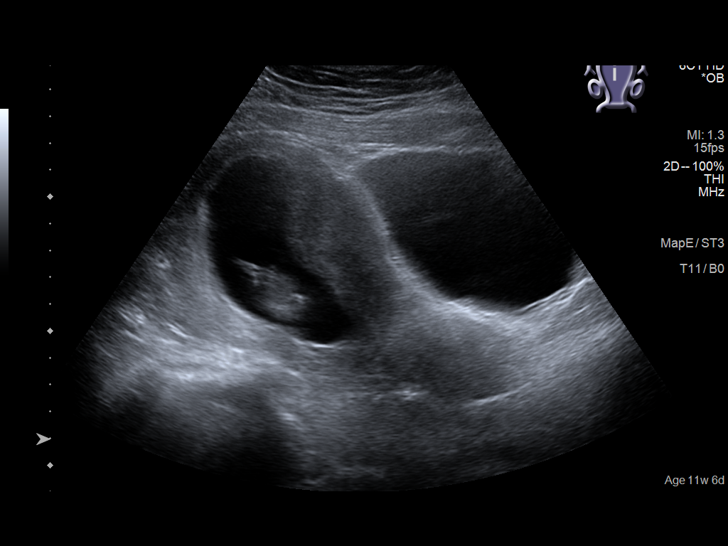
[im 25/74]
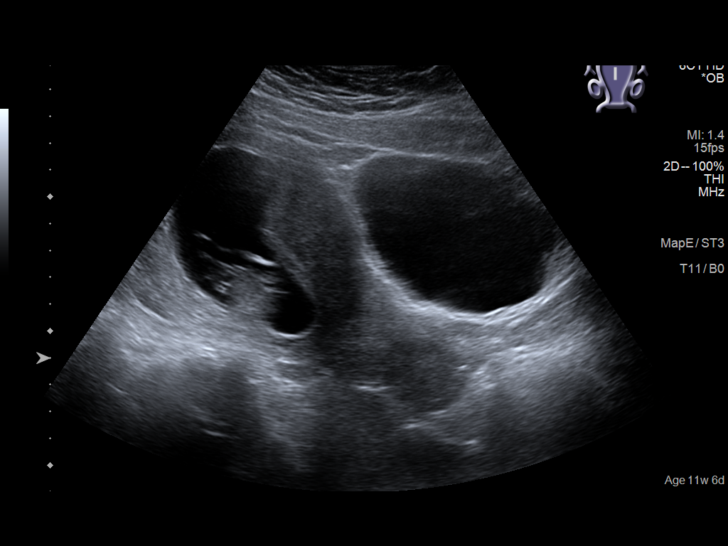
[im 30/74]
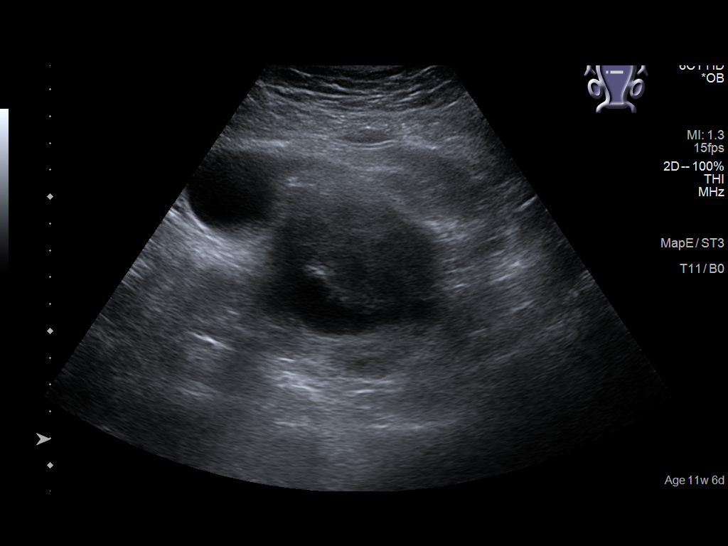
[im 36/74]
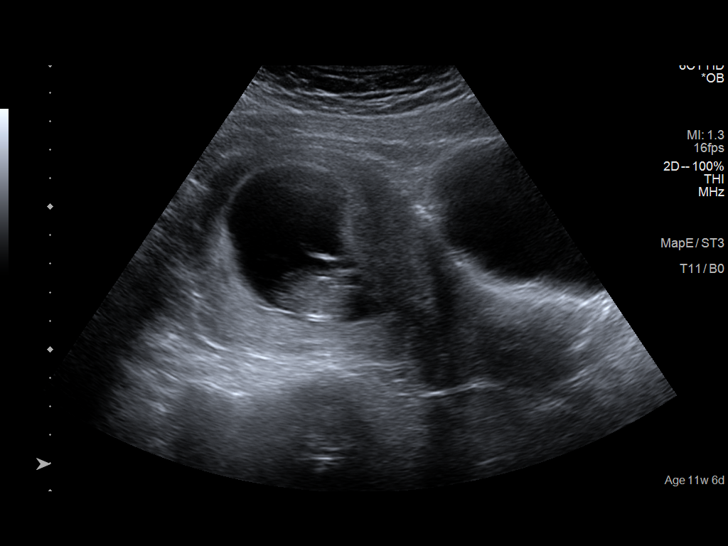
[im 41/74]
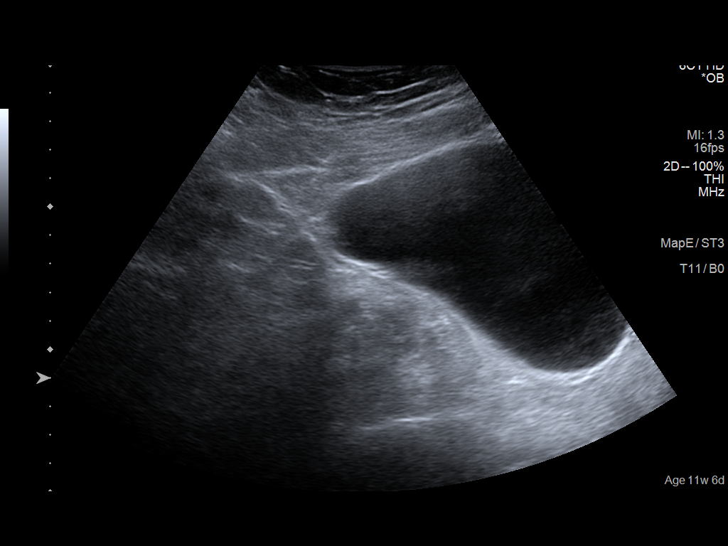
[im 46/74]
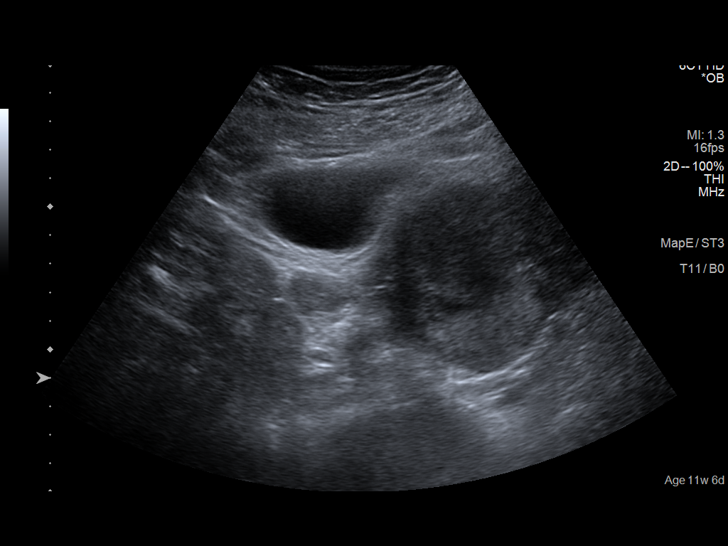
[im 52/74]
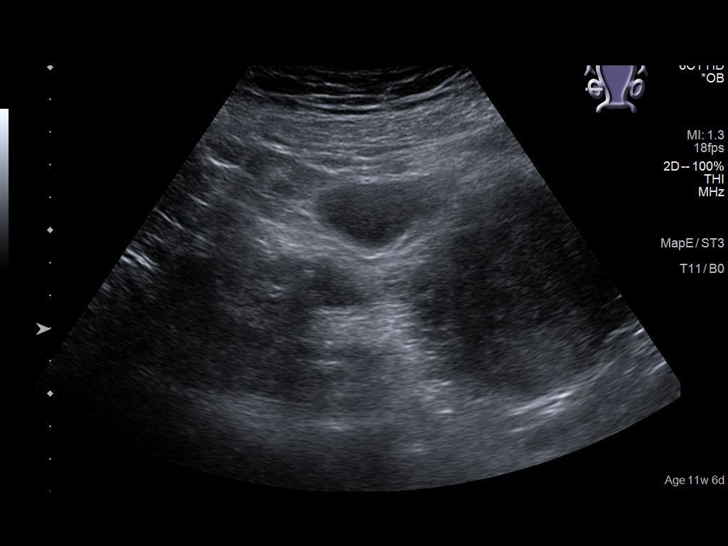
[im 57/74]
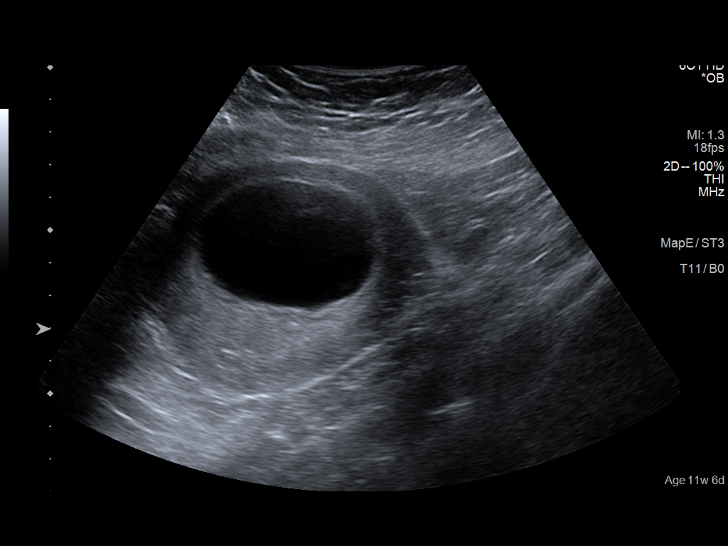
[im 63/74]
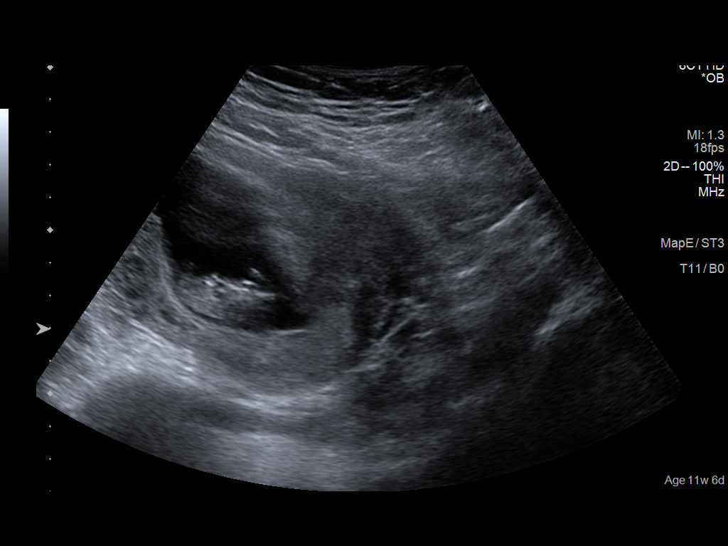
[im 68/74]
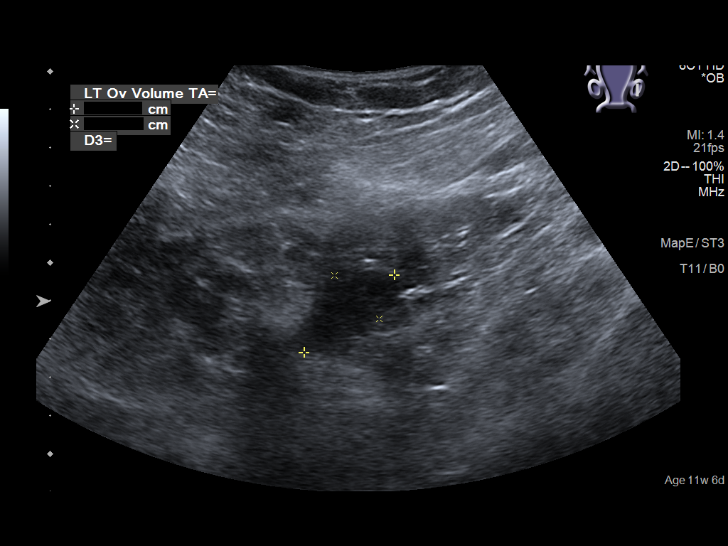
[im 74/74]
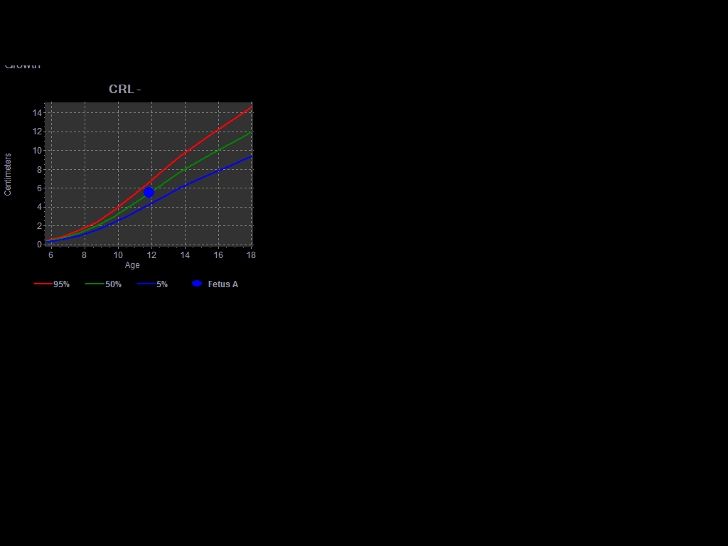

[14 of 28 positions shown; findings below may reference images not displayed]

FINDINGS: Intrauterine gestational sac: Single

Yolk sac:  Visualized.

Embryo:  Visualized.

Cardiac Activity: Visualized.

Heart Rate: 169 bpm

CRL:   55.3 mm   12 w 1 d                  US EDC: 08/09/2021

Subchorionic hemorrhage:  None visualized.

Maternal uterus/adnexae: Bilateral ovaries within normal limits. No
free fluid within the pelvis.
IMPRESSION: 1. Single live intrauterine gestation measuring 12 weeks 1 day by
crown-rump length.
2. Active fetal heart tones at 169 bpm.

## 2022-08-18 IMAGING — US US OB COMP +14 WK
1 series · 13 of 28 positions shown · non-contrast
Comparison: none

CLINICAL DATA: Pregnancy.  Assess fetal anatomy

EXAM:
OBSTETRICAL ULTRASOUND >14 WKS

[Series 1: us ob comp + 14 wk · 13 of 68 slices shown]
[im 3/68]
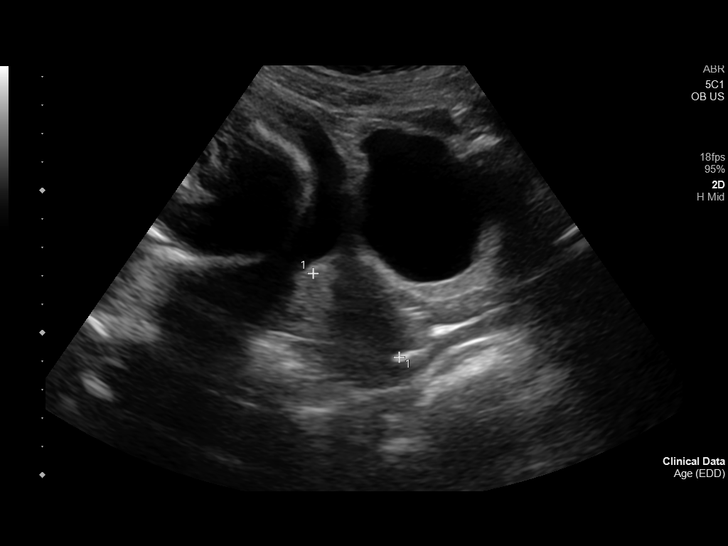
[im 8/68]
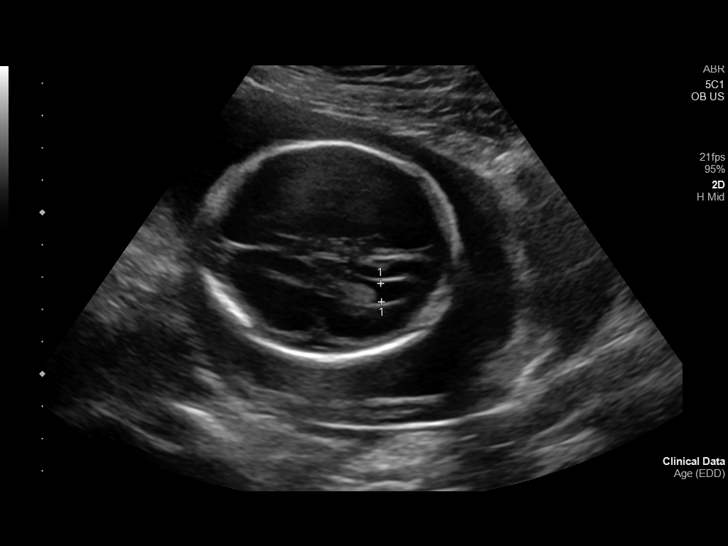
[im 13/68]
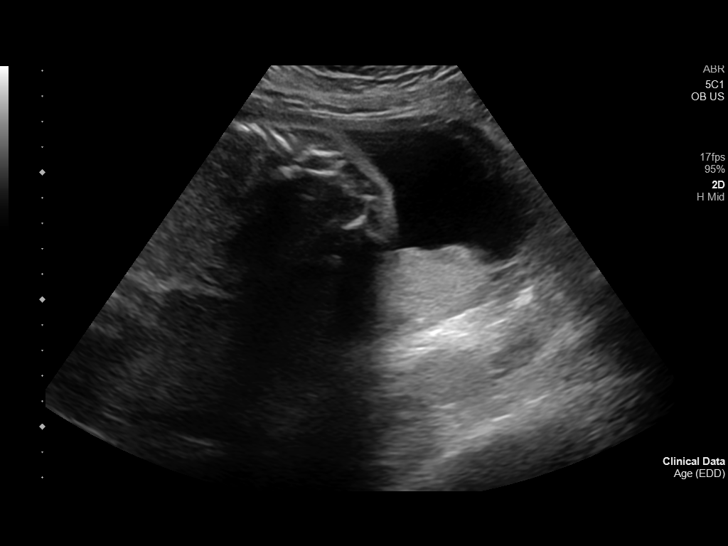
[im 18/68]
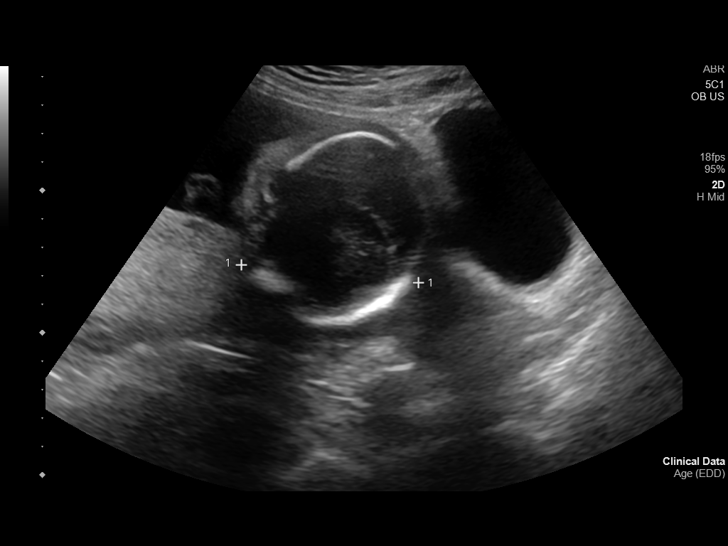
[im 23/68]
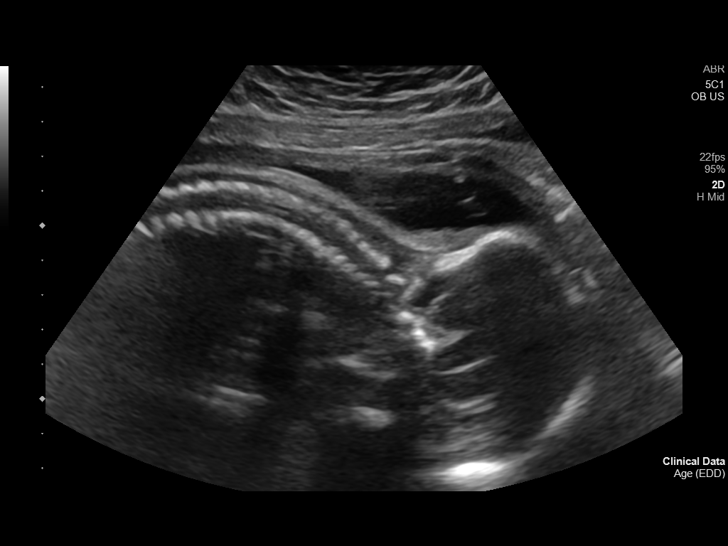
[im 28/68]
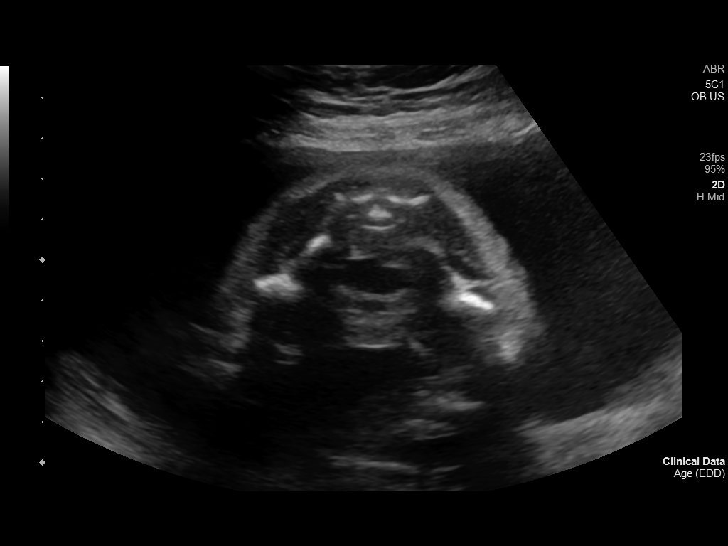
[im 35/68]
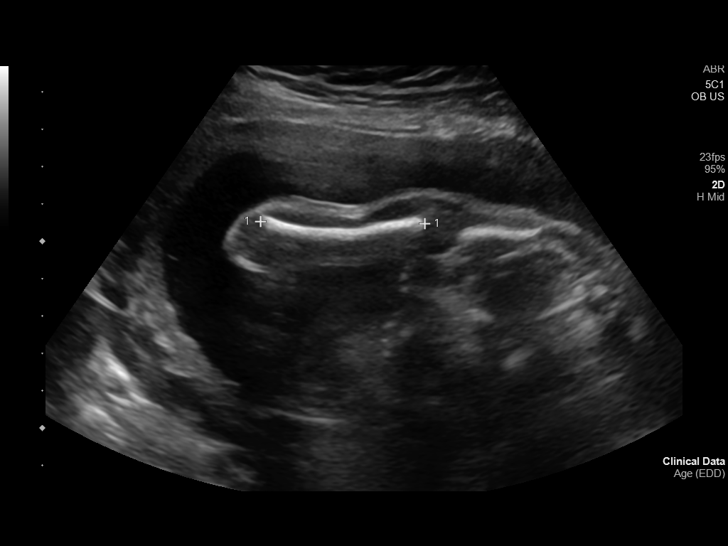
[im 40/68]
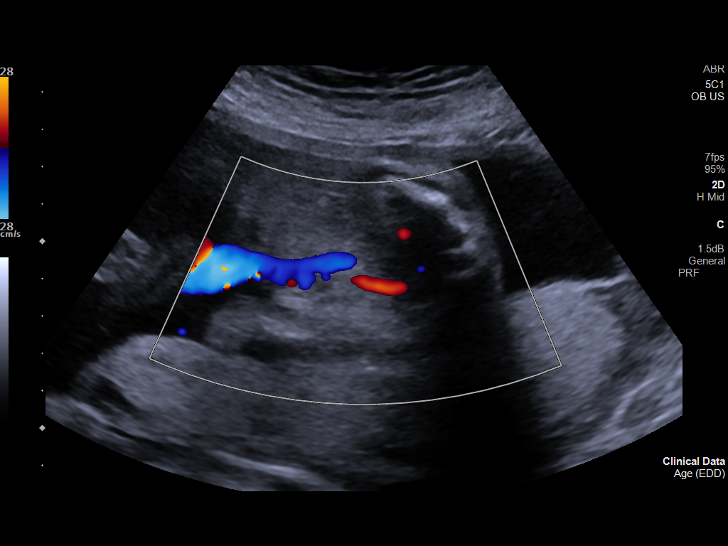
[im 45/68]
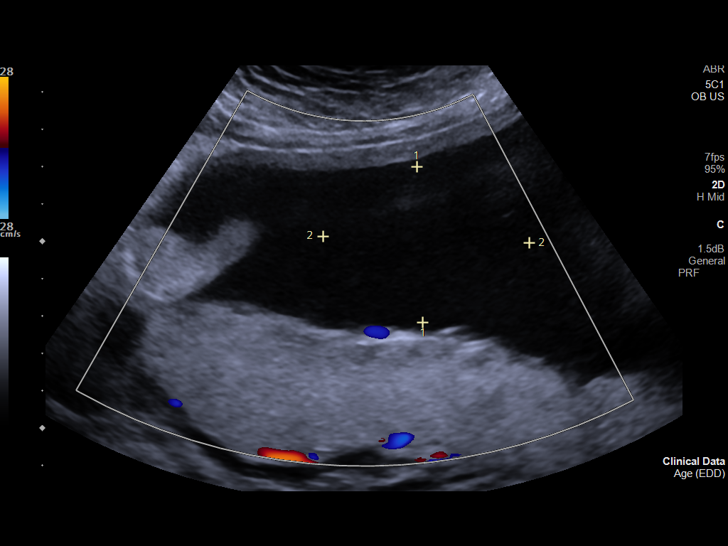
[im 50/68]
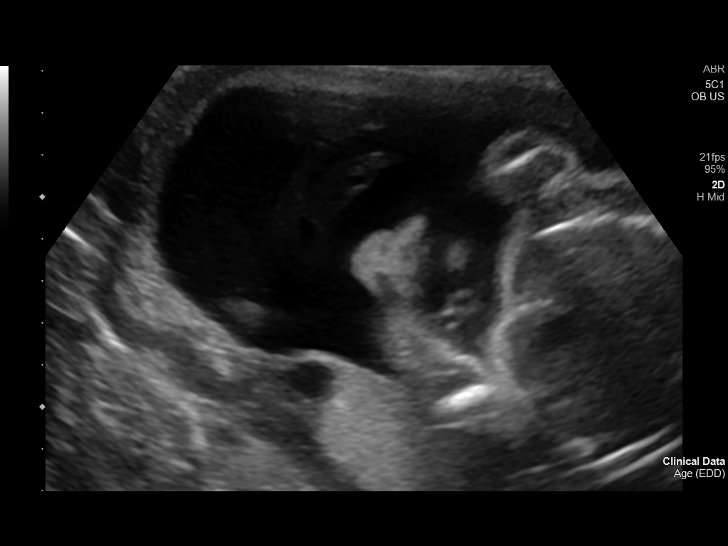
[im 55/68]
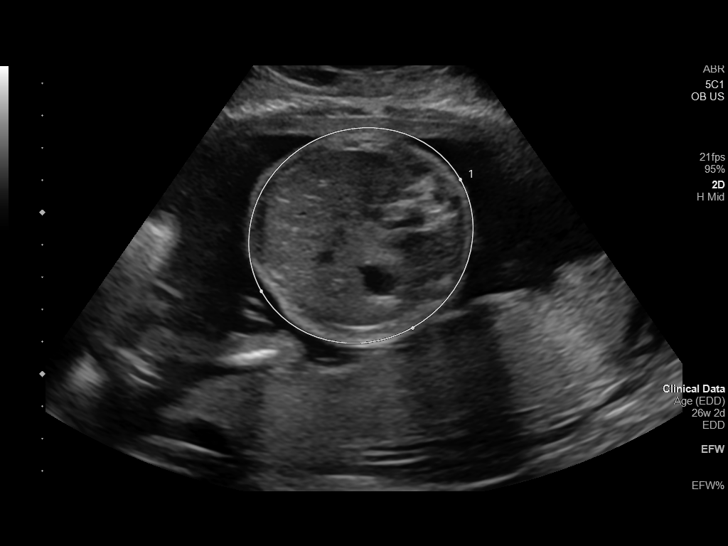
[im 60/68]
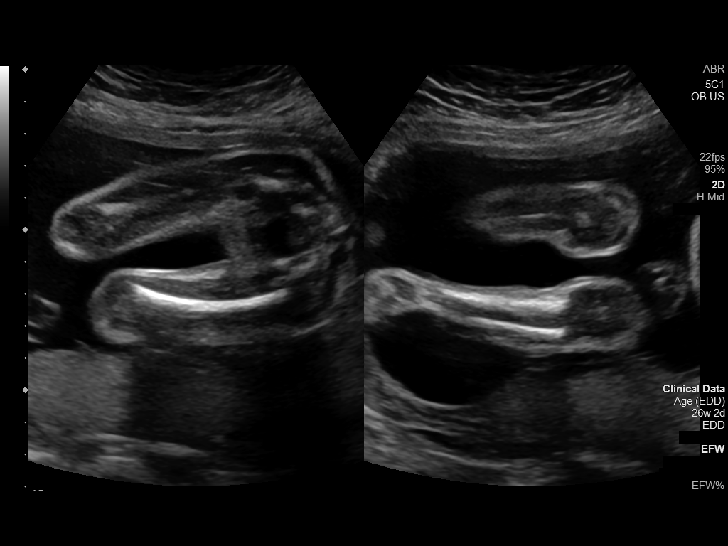
[im 65/68]
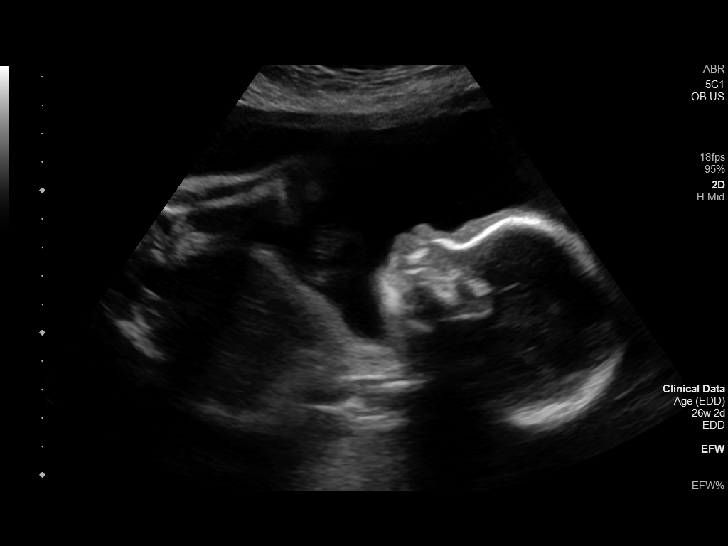

[13 of 28 positions shown; findings below may reference images not displayed]

FINDINGS: Number of Fetuses: 1

Heart Rate:  164 bpm

Movement: Yes

Presentation: Cephalic

Previa: No

Placental Location: Posterior

Amniotic Fluid (Subjective): Normal

Amniotic Fluid (Objective):

Vertical pocket = 4.2cm

FETAL BIOMETRY

BPD: 6.6cm 26w 4d

HC:   24.7cm 26w 6d

AC:   21.3cm 25w 5d

FL:   4.6cm 25w 2d

Current Mean GA: 26w 0d US EDC: 08/13/2021

Assigned GA:  26w 2d Assigned EDC: 08/11/2021

Estimated Fetal Weight:  845g 18%ile

FETAL ANATOMY

Lateral Ventricles: Appears normal

Thalami/CSP: Appears normal

Posterior Fossa:  Appears normal

Nuchal Region: Appears normal   NFT= N/A > 20 WKS

Upper Lip: Appears normal

Spine: Appears normal

4 Chamber Heart on Left: Not well visualized

LVOT: Not visualized

RVOT: Not visualized

Stomach on Left: Appears normal

3 Vessel Cord: Appears normal

Cord Insertion site: Appears normal

Kidneys: Appears normal

Bladder: Appears normal

Extremities: Appears normal

Technically difficult due to: Fetal positioning

Maternal Findings:

Cervix:  Closed.  4.2 cm
IMPRESSION: 1. Single live intrauterine gestation in cephalic presentation.
2. Assigned gestational age of 26 weeks 2 days. Adequate interval
growth.
3. Limited visualization of the fetal 4 chamber heart and cardiac
ventricular outflow tracts. Attention on follow-up.
4. Remainder of the fetal anatomic survey is within normal limits.
No fetal anomaly identified.

## 2022-09-12 IMAGING — US US OB FOLLOW-UP
1 series · 15 of 28 positions shown · non-contrast
Comparison: none

CLINICAL DATA: Follow-up incomplete fetal anatomy evaluation and
growth.

EXAM:
OBSTETRIC 14+ WK ULTRASOUND FOLLOW-UP

[Series 1: us ob follow up · 15 of 54 slices shown]
[im 1/54]
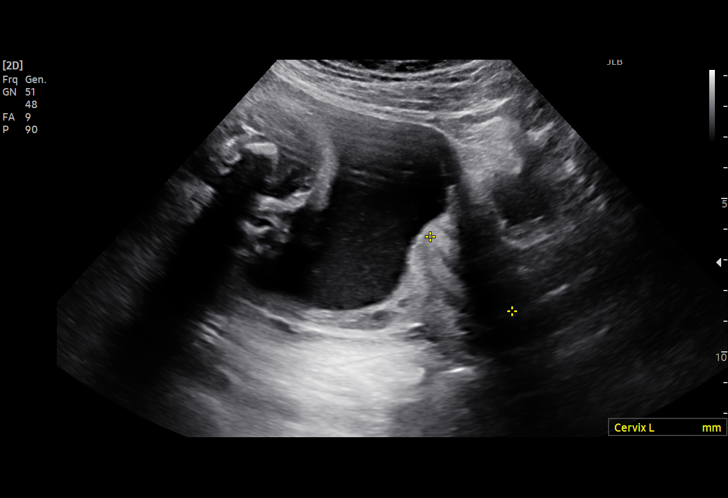
[im 4/54]
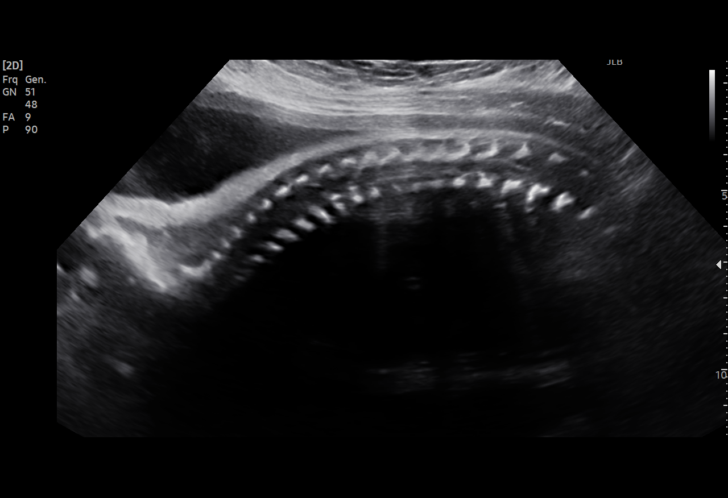
[im 8/54]
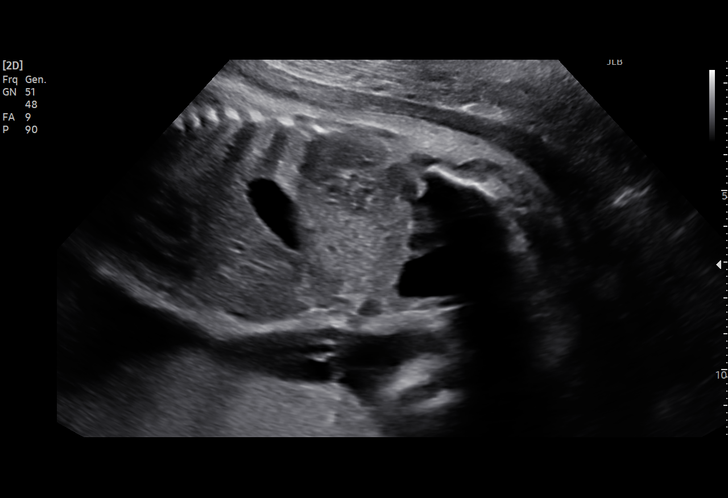
[im 12/54]
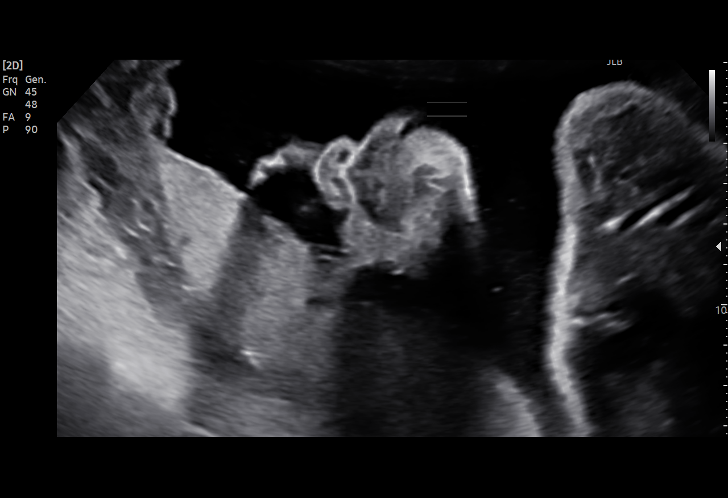
[im 16/54]
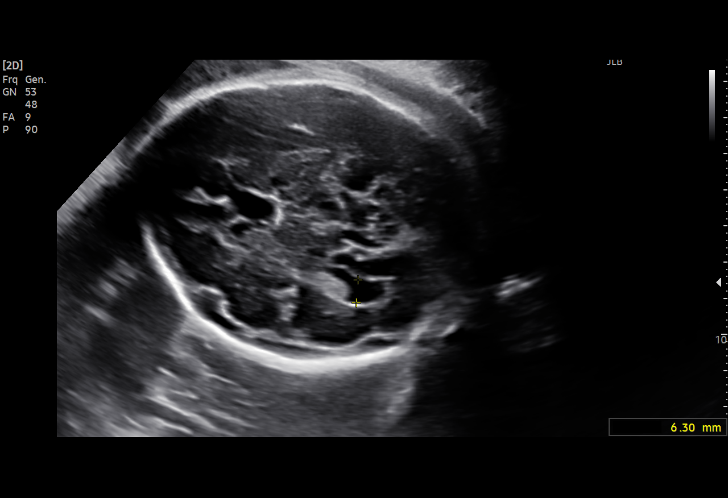
[im 20/54]
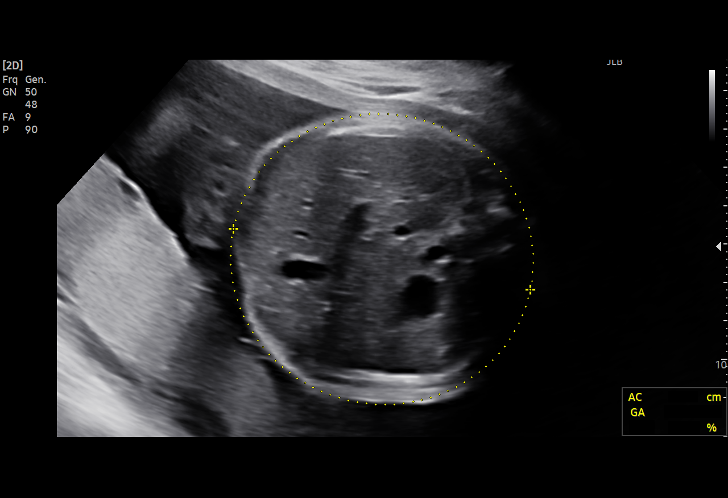
[im 24/54]
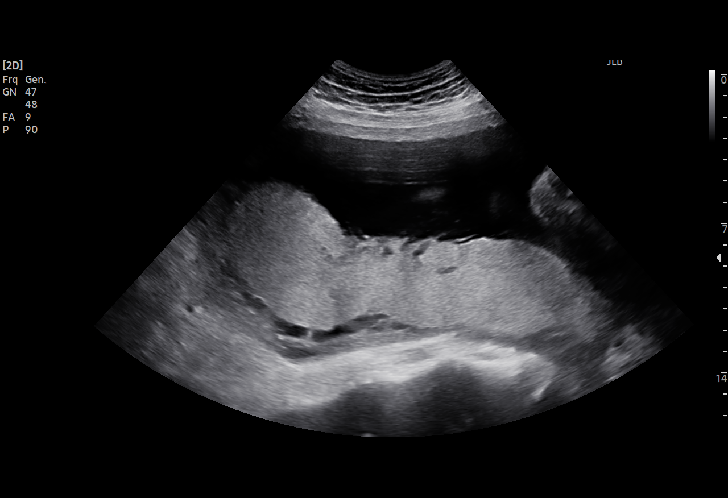
[im 28/54]
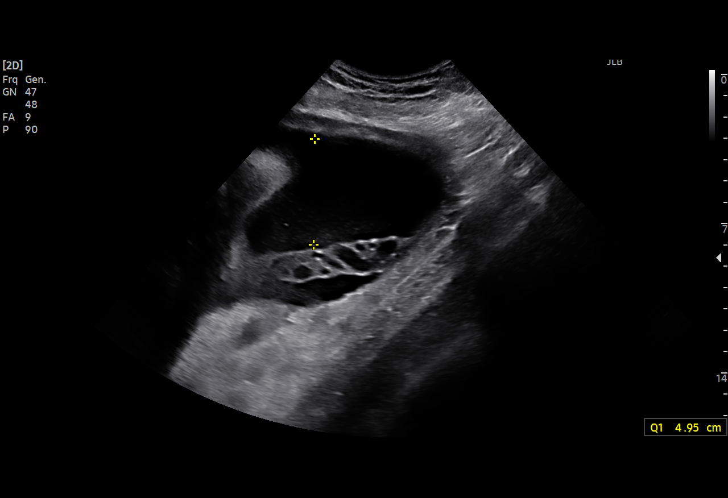
[im 30/54]
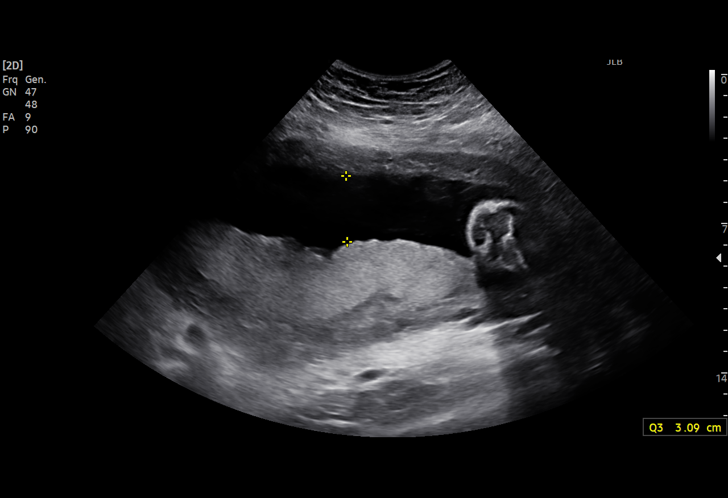
[im 34/54]
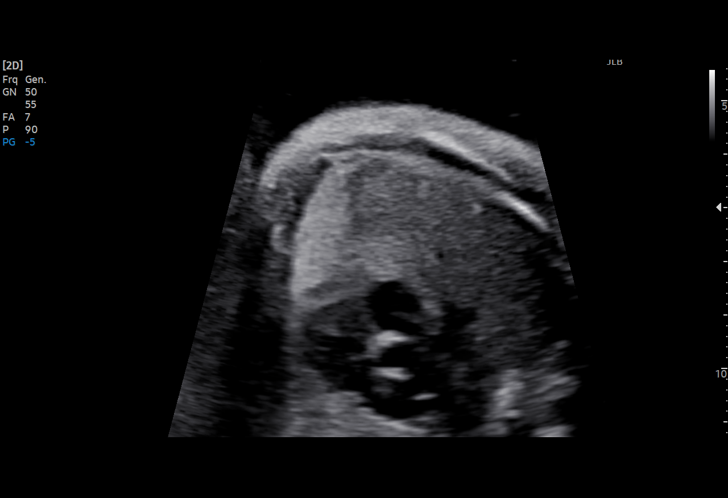
[im 38/54]
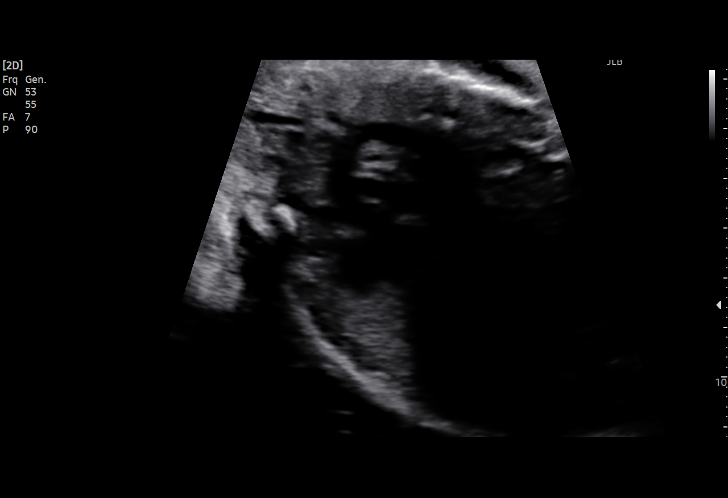
[im 42/54]
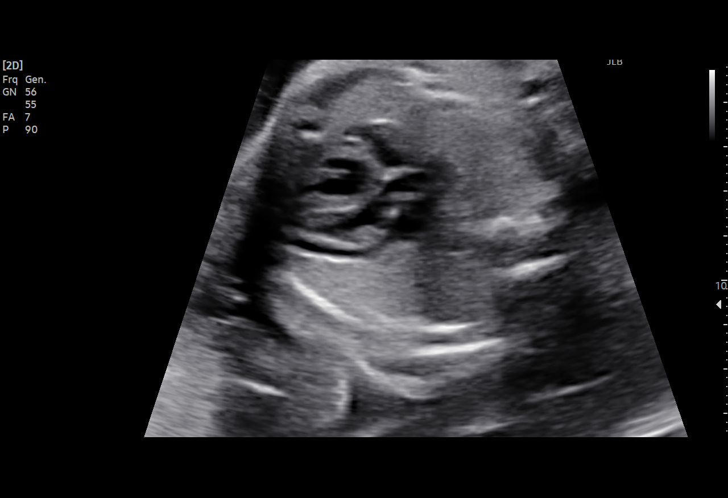
[im 46/54]
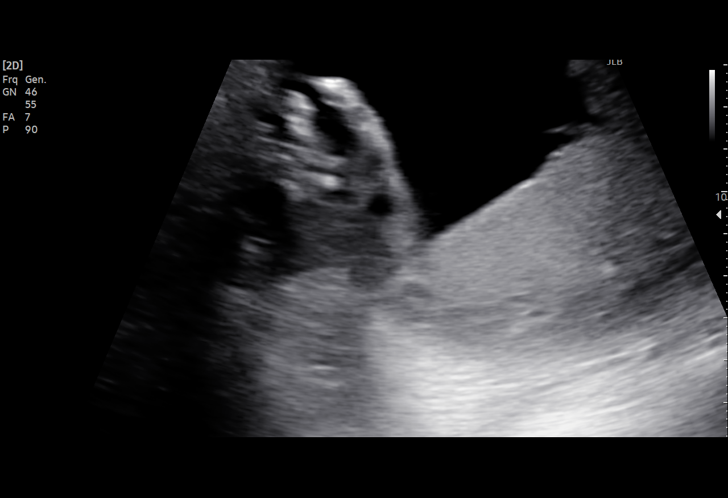
[im 50/54]
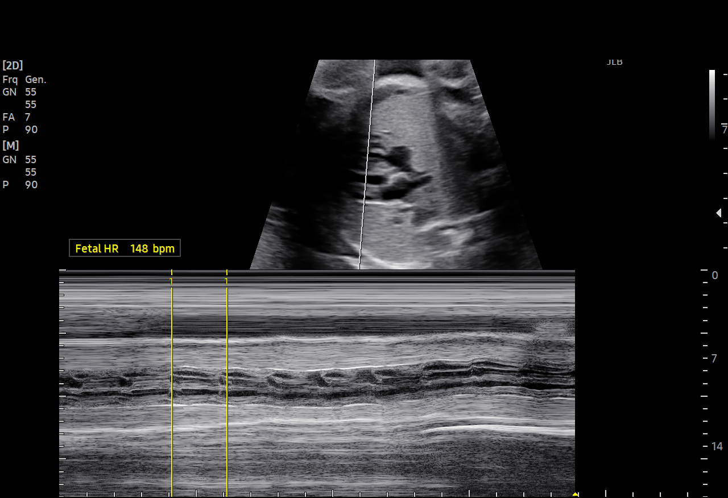
[im 54/54]
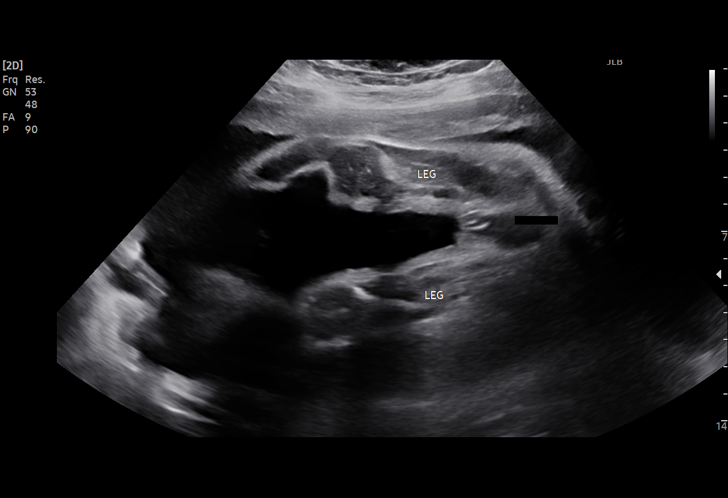

[15 of 28 positions shown; findings below may reference images not displayed]

FINDINGS: Number of Fetuses: 1

Heart Rate:  148 bpm

Movement: Yes

Presentation: Breech

Previa: No

Placental Location: Posterior

Amniotic Fluid (Subjective): Within normal limits

Amniotic Fluid (Objective):

AFI 17.5 cm (5%ile= 9.2 cm, 95%= 23.1 cm for 29 wks)

FETAL BIOMETRY

BPD:  7.8cm 31w 3d

HC:    27.6cm 30w 1d

AC:    24.7cm 29w 0d

FL:    5.3cm 28w 2d

Current Mean GA: 29w 1d US EDC: 08/16/2021

Assigned GA: 29w 6d Assigned EDC: 08/11/2021

Estimated Fetal Weight:  1,324g 37%ile

FETAL ANATOMY

Lateral Ventricles: Appears normal

Thalami/CSP: Appears normal

Posterior Fossa: Appears normal

Nuchal Region: Previously seen

Upper Lip: Appears normal

Spine: Appears normal

4 Chamber Heart on Left: Appears normal

LVOT: Appears normal

RVOT: Appears normal

Stomach on Left: Appears normal

3 Vessel Cord: Appears normal

Cord Insertion site: Appears normal

Kidneys: Appears normal

Bladder: Appears normal

Extremities: Previously seen

Maternal Findings:

Cervix:  3.5 cm TA
IMPRESSION: Assigned GA currently 29 weeks 6 days. Appropriate fetal growth,
with EFW currently at 37 %ile.

Four-chamber heart and cardiac outflow tracts were visualized on
today's exam. No fetal anatomic abnormality identified.

## 2022-12-14 ENCOUNTER — Ambulatory Visit
Admission: EM | Admit: 2022-12-14 | Discharge: 2022-12-14 | Disposition: A | Discharge status: Home / Self Care | Payer: No Typology Code available for payment source | Attending: Emergency Medicine | Admitting: Emergency Medicine

## 2022-12-14 DIAGNOSIS — J069 Acute upper respiratory infection, unspecified: Secondary | ICD-10-CM | POA: Diagnosis present

## 2022-12-14 LAB — GROUP A STREP BY PCR: Group A Strep by PCR: NOT DETECTED

## 2022-12-14 MED ORDER — NYSTATIN 100000 UNIT/ML MT SUSP: 5.0000 mL | Freq: Four times a day (QID) | OROMUCOSAL | 0 refills | Status: AC | PRN

## 2022-12-14 MED ORDER — PROMETHAZINE-DM 6.25-15 MG/5ML PO SYRP: 5.0000 mL | ORAL_SOLUTION | Freq: Four times a day (QID) | ORAL | 0 refills | Status: AC | PRN

## 2022-12-14 MED ORDER — BENZONATATE 100 MG PO CAPS: 100.0000 mg | ORAL_CAPSULE | Freq: Three times a day (TID) | ORAL | 0 refills | Status: AC

## 2022-12-14 NOTE — ED Triage Notes (Signed)
Pt c/o sore throat, aches since Friday. Pt states she did a home covid test today (negative result)

## 2022-12-14 NOTE — ED Provider Notes (Signed)
[facility]    CSN: [id number] Arrival date & time: 12/14/22  1638      History   Chief Complaint Chief Complaint  Patient presents with   Sore Throat    HPI Jennifer Harper is a 32 y.o. female.   Patient presents for evaluation of nasal congestion, rhinorrhea, sore throat and increased fatigue present for 5 days.  Began to experience watery diarrhea last night with last occurrence this morning.  Decreased appetite but tolerating some food and liquids.  No known sick contacts prior.  Feels as though symptoms are worsening and she has begun to feel like the lateral aspects of her neck are swollen.  Denies difficulty swallowing, cough or shortness of breath.  Has attempted use of Mucinex and NyQuil which has been minimally helpful.   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 Authorizing Provider  amphetamine-dextroamphetamine (ADDERALL XR) 20 MG 24 hr capsule Take by mouth. 10/21/22  Yes [provider]  clonazePAM (KLONOPIN) 0.5 MG tablet Take by mouth. 11/05/21  Yes [provider]  etonogestrel-ethinyl estradiol (NUVARING) 0.12-0.015 MG/24HR vaginal ring Insert vaginally and leave in place for 3 consecutive weeks, then remove for 1 week. 11/12/21    Rubie Maid, MD  sertraline (ZOLOFT) 100 MG tablet Take 1 tablet (100 mg total) by mouth daily. 09/29/21   Rubie Maid, MD    Family History Family History  Problem Relation Age of Onset   Clotting disorder Mother    Anemia Mother    Heart disease Father    Hypertension Father     Social History Social History   Tobacco Use   Smoking status: Former    Types: Cigarettes    Quit date: 2011    Years since quitting: 13.1   Smokeless tobacco: Current   Tobacco comments:    vapes  Vaping Use   Vaping Use: Every day   Substances: Nicotine  Substance Use Topics   Alcohol use: Not Currently    Alcohol/week: 2.0 standard drinks of alcohol    Types: 2 Glasses of wine per week   Drug use: No     Allergies   Paroxetine hcl   Review of Systems Review of Systems  Constitutional:  Positive for fatigue. Negative for activity change, appetite change, chills, diaphoresis, fever and unexpected weight change.  HENT:  Positive for congestion, rhinorrhea and sore throat. Negative for dental problem, drooling, ear discharge, ear pain, facial swelling, hearing loss, mouth sores, nosebleeds, postnasal drip,  sinus pressure, sinus pain, sneezing, tinnitus, trouble swallowing and voice change.   Respiratory: Negative.    Cardiovascular: Negative.   Gastrointestinal: Negative.   Skin: Negative.   Neurological: Negative.      Physical Exam Triage Vital Signs ED Triage Vitals  Enc Vitals Group     BP 12/14/22 1721 (!) 146/100     Pulse Rate 12/14/22 1721 100     Resp --      Temp 12/14/22 1721 99.1 F (37.3 C)     Temp Source 12/14/22 1721 Oral     SpO2 12/14/22 1721 99 %     Weight 12/14/22 1719 184 lb (83.5 kg)     Height 12/14/22 1719 5\' 5"  (1.651 m)     Head Circumference --      Peak Flow --      Pain Score 12/14/22 1719 7     Pain Loc --      Pain Edu? --      Excl. in [place]? --    No data found.  Updated Vital Signs BP (!) 146/100 (BP Location: Left Arm)   Pulse 100    Temp 99.1 F (37.3 C) (Oral)   Ht 5\' 5"  (1.651 m)   Wt 184 lb (83.5 kg)   LMP 12/07/2022 (Approximate)   SpO2 99%   BMI 30.62 kg/m   Visual Acuity Right Eye Distance:   Left Eye Distance:   Bilateral Distance:    Right Eye Near:   Left Eye Near:    Bilateral Near:     Physical Exam Constitutional:      Appearance: She is well-developed.  HENT:     Head: Normocephalic.     Right Ear: Tympanic membrane and ear canal normal.     Left Ear: Tympanic membrane and ear canal normal.     Nose: Congestion and rhinorrhea present.     Mouth/Throat:     Mouth: Mucous membranes are moist.     Pharynx: Posterior oropharyngeal erythema present.     Tonsils: No tonsillar exudate. 0 on the right. 0 on the left.  Cardiovascular:     Rate and Rhythm: Normal rate and regular rhythm.     Pulses: Normal pulses.     Heart sounds: Normal heart sounds.  Pulmonary:     Effort: Pulmonary effort is normal.     Breath sounds: Normal breath sounds.  Musculoskeletal:     Cervical back: Normal range of motion.  Skin:    General: Skin is warm and dry.  Neurological:     Mental Status: She is alert. Mental status is at baseline.    UC Treatments / Results  Labs (all labs ordered are listed, but only abnormal results are displayed) Labs Reviewed  GROUP A STREP BY PCR    EKG   Radiology No results found.  Procedures Procedures (including critical care time)  Medications Ordered in UC Medications - No data to display  Initial Impression / Assessment and Plan / UC Course  I have reviewed the triage vital signs and the nursing notes.  Pertinent labs & imaging results that were available during my care of the patient were reviewed by me and considered in my medical decision making (see chart for details).  Viral URI with cough  Patient is in no signs of distress nor toxic appearing.  Vital signs are stable.  Low suspicion for pneumonia, pneumothorax or bronchitis and therefore will  defer imaging.  Testing deferred due to timeline of illness.  Strep test negative, discussed with patient.  Prescribed Tessalon, Promethazine DM and Magic mouthwash for sore throat and cough for most worrisome symptoms.May use additional over-the-counter medications as needed for supportive care.  May follow-up with urgent care as needed if symptoms persist or worsen.  Note given.   Final Clinical Impressions(s) / UC Diagnoses   Final diagnoses:  None   Discharge Instructions   None    ED Prescriptions   None    PDMP not reviewed this encounter.   Hans Eden, NP 12/14/22 1756

## 2022-12-14 NOTE — Discharge Instructions (Signed)
Your symptoms today are most likely being caused by a virus and should steadily improve in time it can take up to 7 to 10 days before you truly start to see a turnaround however things will get better     Strep test is negative for bacteria to the throat  You may use Tessalon pill every 8 hours to help calm your coughing  You may use cough syrup every 6 hours as needed for additional comfort, be mindful this will make you drowsy  You may gargle and spit Magic mouthwash solution every 4-6 hours as needed to provide temporary relief to your throat  You can take Tylenol and/or Ibuprofen as needed for fever reduction and pain relief.   For cough: honey 1/2 to 1 teaspoon (you can dilute the honey in water or another fluid).  You can also use guaifenesin and dextromethorphan for cough. You can use a humidifier for chest congestion and cough.  If you don't have a humidifier, you can sit in the bathroom with the hot shower running.      For sore throat: try warm salt water gargles, cepacol lozenges, throat spray, warm tea or water with lemon/honey, popsicles or ice, or OTC cold relief medicine for throat discomfort.   For congestion: take a daily anti-histamine like Zyrtec, Claritin, and a oral decongestant, such as pseudoephedrine.  You can also use Flonase 1-2 sprays in each nostril daily.   It is important to stay hydrated: drink plenty of fluids (water, gatorade/powerade/pedialyte, juices, or teas) to keep your throat moisturized and help further relieve irritation/discomfort.

## 2022-12-17 ENCOUNTER — Telehealth: Payer: No Typology Code available for payment source | Admitting: Physician Assistant

## 2022-12-17 DIAGNOSIS — J019 Acute sinusitis, unspecified: Secondary | ICD-10-CM

## 2022-12-17 DIAGNOSIS — B9689 Other specified bacterial agents as the cause of diseases classified elsewhere: Secondary | ICD-10-CM | POA: Diagnosis not present

## 2022-12-17 MED ORDER — AMOXICILLIN-POT CLAVULANATE 875-125 MG PO TABS
1.0000 | ORAL_TABLET | Freq: Two times a day (BID) | ORAL | 0 refills | Status: AC
Start: 2022-12-17 — End: ?

## 2022-12-17 NOTE — Patient Instructions (Signed)
Jennifer Frederickson Pendley, thank you for joining Mar Daring, PA-C for today's virtual visit.  While this provider is not your primary care provider (PCP), if your PCP is located in our provider database this encounter information will be shared with them immediately following your visit.   [facility] account gives you access to today's visit and all your visits, tests, and labs performed at [facility] " click here if you don't have a [facility] account or go to [email or web address]  Consent: (Patient) Jennifer Harper provided verbal consent for this virtual visit at the beginning of the encounter.  Current Medications:  Current Outpatient Medications:    amoxicillin-clavulanate (AUGMENTIN) 875-125 MG tablet, Take 1 tablet by mouth 2 (two) times daily., Disp: 14 tablet, Rfl: 0   amphetamine-dextroamphetamine (ADDERALL XR) 20 MG 24 hr capsule, Take by mouth., Disp: , Rfl:    benzonatate (TESSALON) 100 MG capsule, Take 1 capsule (100 mg total) by mouth every 8 (eight) hours., Disp: 21 capsule, Rfl: 0   clonazePAM (KLONOPIN) 0.5 MG tablet, Take by mouth., Disp: , Rfl:    etonogestrel-ethinyl estradiol (NUVARING) 0.12-0.015 MG/24HR vaginal ring, Insert vaginally and leave in place for 3 consecutive weeks, then remove for 1 week., Disp: 3 each, Rfl: 4   magic mouthwash (nystatin, lidocaine, diphenhydrAMINE, alum & mag hydroxide) suspension, Swish and spit 5 mLs 4 (four) times daily as needed for mouth pain., Disp: 180 mL, Rfl: 0   promethazine-dextromethorphan (PROMETHAZINE-DM) 6.25-15 MG/5ML syrup, Take 5 mLs by mouth 4 (four) times daily as needed for cough., Disp: 118 mL, Rfl: 0   sertraline (ZOLOFT) 100 MG tablet, Take 1 tablet (100 mg total) by mouth daily., Disp: 90 tablet, Rfl: 1   Medications ordered in this encounter:  Meds ordered this encounter  Medications   amoxicillin-clavulanate (AUGMENTIN) 875-125 MG tablet    Sig: Take 1 tablet by  mouth 2 (two) times daily.    Dispense:  14 tablet    Refill:  0    Order Specific Question:   Supervising Provider    Answer:   Chase Picket [id number]     *If you need refills on other medications prior to your next appointment, please contact your pharmacy*  Follow-Up: Call back or seek an in-person evaluation if the symptoms worsen or if the condition fails to improve as anticipated.  [place] [phone]  Other Instructions  Sinus Infection, Adult A sinus infection, also called sinusitis, is inflammation of your sinuses. Sinuses are hollow spaces in the bones around your face. Your sinuses are located: Around your eyes. In the middle of your forehead. Behind your nose. In your cheekbones. Mucus normally drains out of your sinuses. When your nasal tissues become inflamed or swollen, mucus can become trapped or blocked. This allows bacteria, viruses, and fungi to grow, which leads to infection. Most infections of the sinuses are caused by a virus. A sinus infection can develop quickly. It can last for up to 4 weeks (acute) or for more than 12 weeks (chronic). A sinus infection often develops after a cold. What are the causes? This condition is caused by anything that creates swelling in the sinuses or stops mucus from draining. This includes: Allergies. Asthma. Infection from bacteria or viruses. Deformities or blockages in your nose or sinuses. Abnormal growths in the nose (nasal polyps). Pollutants, such as chemicals or irritants in the air. Infection from fungi. This is rare. What increases the risk? You are more  likely to develop this condition if you: Have a weak body defense system (immune system). Do a lot of swimming or diving. Overuse nasal sprays. Smoke. What are the signs or symptoms? The main symptoms of this condition are pain and a feeling of pressure around the affected sinuses. Other symptoms include: Stuffy nose or congestion that  makes it difficult to breathe through your nose. Thick yellow or greenish drainage from your nose. Tenderness, swelling, and warmth over the affected sinuses. A cough that may get worse at night. Decreased sense of smell and taste. Extra mucus that collects in the throat or the back of the nose (postnasal drip) causing a sore throat or bad breath. Tiredness (fatigue). Fever. How is this diagnosed? This condition is diagnosed based on: Your symptoms. Your medical history. A physical exam. Tests to find out if your condition is acute or chronic. This may include: Checking your nose for nasal polyps. Viewing your sinuses using a device that has a light (endoscope). Testing for allergies or bacteria. Imaging tests, such as an MRI or CT scan. In rare cases, a bone biopsy may be done to rule out more serious types of fungal sinus disease. How is this treated? Treatment for a sinus infection depends on the cause and whether your condition is chronic or acute. If caused by a virus, your symptoms should go away on their own within 10 days. You may be given medicines to relieve symptoms. They include: Medicines that shrink swollen nasal passages (decongestants). A spray that eases inflammation of the nostrils (topical intranasal corticosteroids). Rinses that help get rid of thick mucus in your nose (nasal saline washes). Medicines that treat allergies (antihistamines). Over-the-counter pain relievers. If caused by bacteria, your health care provider may recommend waiting to see if your symptoms improve. Most bacterial infections will get better without antibiotic medicine. You may be given antibiotics if you have: A severe infection. A weak immune system. If caused by narrow nasal passages or nasal polyps, surgery may be needed. Follow these instructions at home: Medicines Take, use, or apply over-the-counter and prescription medicines only as told by your health care provider. These may  include nasal sprays. If you were prescribed an antibiotic medicine, take it as told by your health care provider. Do not stop taking the antibiotic even if you start to feel better. Hydrate and humidify  Drink enough fluid to keep your urine pale yellow. Staying hydrated will help to thin your mucus. Use a cool mist humidifier to keep the humidity level in your home above 50%. Inhale steam for 10-15 minutes, 3-4 times a day, or as told by your health care provider. You can do this in the bathroom while a hot shower is running. Limit your exposure to cool or dry air. Rest Rest as much as possible. Sleep with your head raised (elevated). Make sure you get enough sleep each night. General instructions  Apply a warm, moist washcloth to your face 3-4 times a day or as told by your health care provider. This will help with discomfort. Use nasal saline washes as often as told by your health care provider. Wash your hands often with soap and water to reduce your exposure to germs. If soap and water are not available, use hand sanitizer. Do not smoke. Avoid being around people who are smoking (secondhand smoke). Keep all follow-up visits. This is important. Contact a health care provider if: You have a fever. Your symptoms get worse. Your symptoms do not improve within 10  days. Get help right away if: You have a severe headache. You have persistent vomiting. You have severe pain or swelling around your face or eyes. You have vision problems. You develop confusion. Your neck is stiff. You have trouble breathing. These symptoms may be an emergency. Get help right away. Call 911. Do not wait to see if the symptoms will go away. Do not drive yourself to the hospital. Summary A sinus infection is soreness and inflammation of your sinuses. Sinuses are hollow spaces in the bones around your face. This condition is caused by nasal tissues that become inflamed or swollen. The swelling traps or  blocks the flow of mucus. This allows bacteria, viruses, and fungi to grow, which leads to infection. If you were prescribed an antibiotic medicine, take it as told by your health care provider. Do not stop taking the antibiotic even if you start to feel better. Keep all follow-up visits. This is important. This information is not intended to replace advice given to you by your health care provider. Make sure you discuss any questions you have with your health care provider. Document Revised: 09/29/2021 Document Reviewed: 09/29/2021 Elsevier Patient Education  [employer].    If you have been instructed to have an in-person evaluation today at a local Urgent Care facility, please use the link below. It will take you to a list of all of our available [facility], including address, phone number and hours of operation. Please do not delay care.  [facility]  If you or a family member do not have a primary care provider, use the link below to schedule a visit and establish care. When you choose a [facility] primary care physician or advanced practice provider, you gain a long-term partner in health. Find a Primary Care Provider  Learn more about [facility] in-office and virtual care options: [facility]

## 2022-12-17 NOTE — Progress Notes (Signed)
Virtual Visit Consent   Jennifer Harper, you are scheduled for a virtual visit with a [place] provider today. Just as with appointments in the office, your consent must be obtained to participate. Your consent will be active for this visit and any virtual visit you may have with one of our providers in the next 365 days. If you have a MyChart account, a copy of this consent can be sent to you electronically.  As this is a virtual visit, video technology does not allow for your provider to perform a traditional examination. This may limit your provider's ability to fully assess your condition. If your provider identifies any concerns that need to be evaluated in person or the need to arrange testing (such as labs, EKG, etc.), we will make arrangements to do so. Although advances in technology are sophisticated, we cannot ensure that it will always work on either your end or our end. If the connection with a video visit is poor, the visit may have to be switched to a telephone visit. With either a video or telephone visit, we are not always able to ensure that we have a secure connection.  By engaging in this virtual visit, you consent to the provision of healthcare and authorize for your insurance to be billed (if applicable) for the services provided during this visit. Depending on your insurance coverage, you may receive a charge related to this service.  I need to obtain your verbal consent now. Are you willing to proceed with your visit today? Aleksi Coverstone Salada has provided verbal consent on 12/17/2022 for a virtual visit (video or telephone). Mar Daring, PA-C  Date: 12/17/2022 6:35 PM  Virtual Visit via Video Note   I, Mar Daring, connected with  Jennifer Harper  ([id number], [birth year]) on 12/17/22 at  6:30 PM EST by a video-enabled telemedicine application and verified that I am speaking with the correct person using two identifiers.  Location: Patient:  Virtual Visit Location Patient: Home Provider: Virtual Visit Location Provider: Home Office   I discussed the limitations of evaluation and management by telemedicine and the availability of in person appointments. The patient expressed understanding and agreed to proceed.    History of Present Illness: Jennifer Harper is a 32 y.o. who identifies as a female who was assigned female at birth, and is being seen today for possible sinus infection. Seen in person on 12/14/22 at [facility]. Diagnosed with Viral URI. Symptoms have continued to worsen.   HPI: Sinusitis This is a new problem. The current episode started 1 to 4 weeks ago (Symptoms started on Friday, 12/10/22). The problem has been gradually worsening since onset. There has been no fever. She is experiencing no pain. Associated symptoms include congestion, ear pain, headaches, a hoarse voice and sinus pressure. Pertinent negatives include no chills, coughing or sore throat. (Tooth pain) Past treatments include acetaminophen, oral decongestants and nasal decongestants. The treatment provided no relief.     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   Mood disorder ([place]) 06/08/2019   Difficulty controlling anger 06/01/2019   Insomnia, transient 05/27/2017    Allergies:  Allergies  Allergen Reactions   Paroxetine Hcl Other (See Comments)    bruising bruising    Medications:  Current Outpatient Medications:    amoxicillin-clavulanate (AUGMENTIN) 875-125 MG  tablet, Take 1 tablet by mouth 2 (two) times daily., Disp: 14 tablet, Rfl: 0   amphetamine-dextroamphetamine (ADDERALL XR) 20 MG 24 hr capsule, Take by mouth., Disp: , Rfl:    benzonatate (TESSALON) 100 MG capsule, Take 1 capsule (100 mg total) by mouth every 8 (eight) hours., Disp: 21  capsule, Rfl: 0   clonazePAM (KLONOPIN) 0.5 MG tablet, Take by mouth., Disp: , Rfl:    etonogestrel-ethinyl estradiol (NUVARING) 0.12-0.015 MG/24HR vaginal ring, Insert vaginally and leave in place for 3 consecutive weeks, then remove for 1 week., Disp: 3 each, Rfl: 4   magic mouthwash (nystatin, lidocaine, diphenhydrAMINE, alum & mag hydroxide) suspension, Swish and spit 5 mLs 4 (four) times daily as needed for mouth pain., Disp: 180 mL, Rfl: 0   promethazine-dextromethorphan (PROMETHAZINE-DM) 6.25-15 MG/5ML syrup, Take 5 mLs by mouth 4 (four) times daily as needed for cough., Disp: 118 mL, Rfl: 0   sertraline (ZOLOFT) 100 MG tablet, Take 1 tablet (100 mg total) by mouth daily., Disp: 90 tablet, Rfl: 1  Observations/Objective: Patient is well-developed, well-nourished in no acute distress.  Resting comfortably  at home.  Head is normocephalic, atraumatic.  No labored breathing.  Speech is clear and coherent with logical content.  Patient is alert and oriented at baseline.    Assessment and Plan: 1. Acute bacterial sinusitis - amoxicillin-clavulanate (AUGMENTIN) 875-125 MG tablet; Take 1 tablet by mouth 2 (two) times daily.  Dispense: 14 tablet; Refill: 0  - Worsening symptoms that have not responded to OTC medications.  - Will give Augmentin - Continue allergy medications.  - Steam and humidifier can help - Stay well hydrated and get plenty of rest.  - Seek in person evaluation if no symptom improvement or if symptoms worsen   Follow Up Instructions: I discussed the assessment and treatment plan with the patient. The patient was provided an opportunity to ask questions and all were answered. The patient agreed with the plan and demonstrated an understanding of the instructions.  A copy of instructions were sent to the patient via MyChart unless otherwise noted below.    The patient was advised to call back or seek an in-person evaluation if the symptoms worsen or if the condition  fails to improve as anticipated.  Time:  I spent 8 minutes with the patient via telehealth technology discussing the above problems/concerns.    Mar Daring, PA-C
